# Patient Record
Sex: Male | Born: 1970 | Race: Black or African American | Hispanic: No | Smoking: Current every day smoker
Health system: Southern US, Community
[De-identification: ages and names within clinical notes are randomized; demographics above are authoritative.]

## PROBLEM LIST (undated history)

## (undated) DIAGNOSIS — F329 Major depressive disorder, single episode, unspecified: Secondary | ICD-10-CM

## (undated) DIAGNOSIS — I10 Essential (primary) hypertension: Secondary | ICD-10-CM

## (undated) DIAGNOSIS — F419 Anxiety disorder, unspecified: Secondary | ICD-10-CM

## (undated) DIAGNOSIS — D573 Sickle-cell trait: Secondary | ICD-10-CM

## (undated) DIAGNOSIS — F4310 Post-traumatic stress disorder, unspecified: Secondary | ICD-10-CM

## (undated) DIAGNOSIS — M1990 Unspecified osteoarthritis, unspecified site: Secondary | ICD-10-CM

## (undated) DIAGNOSIS — Z9989 Dependence on other enabling machines and devices: Secondary | ICD-10-CM

## (undated) DIAGNOSIS — J45909 Unspecified asthma, uncomplicated: Secondary | ICD-10-CM

## (undated) DIAGNOSIS — F32A Depression, unspecified: Secondary | ICD-10-CM

## (undated) DIAGNOSIS — G43909 Migraine, unspecified, not intractable, without status migrainosus: Secondary | ICD-10-CM

## (undated) DIAGNOSIS — R51 Headache: Secondary | ICD-10-CM

## (undated) DIAGNOSIS — K219 Gastro-esophageal reflux disease without esophagitis: Secondary | ICD-10-CM

## (undated) DIAGNOSIS — G4733 Obstructive sleep apnea (adult) (pediatric): Secondary | ICD-10-CM

## (undated) DIAGNOSIS — G4730 Sleep apnea, unspecified: Secondary | ICD-10-CM

## (undated) HISTORY — PX: VASECTOMY: SHX75

## (undated) HISTORY — PX: NASAL SEPTUM SURGERY: SHX37

## (undated) HISTORY — PX: BUNIONECTOMY: SHX129

## (undated) HISTORY — PX: COLONOSCOPY: SHX174

## (undated) HISTORY — PX: ESOPHAGOGASTRODUODENOSCOPY ENDOSCOPY: SHX5814

---
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
## 1991-12-01 HISTORY — PX: APPENDECTOMY: SHX54

## 1991-12-01 HISTORY — PX: WRIST FRACTURE SURGERY: SHX121

## 2008-11-30 HISTORY — PX: SHOULDER HEMI-ARTHROPLASTY: SHX5049

## 2011-01-18 ENCOUNTER — Emergency Department (HOSPITAL_COMMUNITY): Payer: Non-veteran care

## 2011-01-18 ENCOUNTER — Emergency Department (HOSPITAL_COMMUNITY)
Admission: EM | Admit: 2011-01-18 | Discharge: 2011-01-18 | Disposition: A | Discharge status: Home / Self Care | Payer: Non-veteran care | Attending: Emergency Medicine | Admitting: Emergency Medicine

## 2011-01-18 DIAGNOSIS — K219 Gastro-esophageal reflux disease without esophagitis: Secondary | ICD-10-CM | POA: Insufficient documentation

## 2011-01-18 DIAGNOSIS — J40 Bronchitis, not specified as acute or chronic: Secondary | ICD-10-CM | POA: Insufficient documentation

## 2011-01-18 DIAGNOSIS — I10 Essential (primary) hypertension: Secondary | ICD-10-CM | POA: Insufficient documentation

## 2011-01-18 DIAGNOSIS — F329 Major depressive disorder, single episode, unspecified: Secondary | ICD-10-CM | POA: Insufficient documentation

## 2011-01-18 DIAGNOSIS — F3289 Other specified depressive episodes: Secondary | ICD-10-CM | POA: Insufficient documentation

## 2011-01-18 DIAGNOSIS — J3489 Other specified disorders of nose and nasal sinuses: Secondary | ICD-10-CM | POA: Insufficient documentation

## 2011-01-18 DIAGNOSIS — G8929 Other chronic pain: Secondary | ICD-10-CM | POA: Insufficient documentation

## 2011-01-18 DIAGNOSIS — R042 Hemoptysis: Secondary | ICD-10-CM | POA: Insufficient documentation

## 2011-01-18 DIAGNOSIS — Z79899 Other long term (current) drug therapy: Secondary | ICD-10-CM | POA: Insufficient documentation

## 2011-01-18 DIAGNOSIS — R059 Cough, unspecified: Secondary | ICD-10-CM | POA: Insufficient documentation

## 2011-01-18 DIAGNOSIS — R05 Cough: Secondary | ICD-10-CM | POA: Insufficient documentation

## 2011-01-18 DIAGNOSIS — J029 Acute pharyngitis, unspecified: Secondary | ICD-10-CM | POA: Insufficient documentation

## 2011-01-18 LAB — RAPID STREP SCREEN (MED CTR MEBANE ONLY): Streptococcus, Group A Screen (Direct): NEGATIVE

## 2011-12-01 HISTORY — PX: SHOULDER ARTHROSCOPY: SHX128

## 2012-03-29 DIAGNOSIS — M19011 Primary osteoarthritis, right shoulder: Secondary | ICD-10-CM | POA: Insufficient documentation

## 2012-03-29 DIAGNOSIS — M25311 Other instability, right shoulder: Secondary | ICD-10-CM | POA: Insufficient documentation

## 2014-01-02 ENCOUNTER — Emergency Department (HOSPITAL_COMMUNITY)
Admission: EM | Admit: 2014-01-02 | Discharge: 2014-01-03 | Disposition: A | Discharge status: Home / Self Care | Payer: Non-veteran care | Attending: Emergency Medicine | Admitting: Emergency Medicine

## 2014-01-02 ENCOUNTER — Encounter (HOSPITAL_COMMUNITY): Payer: Self-pay | Admitting: Emergency Medicine

## 2014-01-02 DIAGNOSIS — IMO0002 Reserved for concepts with insufficient information to code with codable children: Secondary | ICD-10-CM

## 2014-01-02 DIAGNOSIS — F39 Unspecified mood [affective] disorder: Secondary | ICD-10-CM

## 2014-01-02 DIAGNOSIS — Z7289 Other problems related to lifestyle: Secondary | ICD-10-CM

## 2014-01-02 DIAGNOSIS — I10 Essential (primary) hypertension: Secondary | ICD-10-CM | POA: Insufficient documentation

## 2014-01-02 DIAGNOSIS — X789XXA Intentional self-harm by unspecified sharp object, initial encounter: Secondary | ICD-10-CM | POA: Insufficient documentation

## 2014-01-02 DIAGNOSIS — F1010 Alcohol abuse, uncomplicated: Secondary | ICD-10-CM | POA: Insufficient documentation

## 2014-01-02 DIAGNOSIS — S21109A Unspecified open wound of unspecified front wall of thorax without penetration into thoracic cavity, initial encounter: Secondary | ICD-10-CM | POA: Insufficient documentation

## 2014-01-02 DIAGNOSIS — F10929 Alcohol use, unspecified with intoxication, unspecified: Secondary | ICD-10-CM

## 2014-01-02 DIAGNOSIS — F17200 Nicotine dependence, unspecified, uncomplicated: Secondary | ICD-10-CM | POA: Insufficient documentation

## 2014-01-02 DIAGNOSIS — F430 Acute stress reaction: Secondary | ICD-10-CM

## 2014-01-02 DIAGNOSIS — S31109A Unspecified open wound of abdominal wall, unspecified quadrant without penetration into peritoneal cavity, initial encounter: Secondary | ICD-10-CM | POA: Insufficient documentation

## 2014-01-02 HISTORY — DX: Essential (primary) hypertension: I10

## 2014-01-02 HISTORY — DX: Depression, unspecified: F32.A

## 2014-01-02 HISTORY — DX: Major depressive disorder, single episode, unspecified: F32.9
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
## 2014-01-02 NOTE — ED Notes (Signed)
Pt refused to cooperate when asked to change into blue scrubs.
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
## 2014-01-02 NOTE — ED Notes (Signed)
Bed: [facility] Expected date:  Expected time:  Means of arrival:  Comments: EMS/SI/superficial lacs

## 2014-01-03 LAB — RAPID URINE DRUG SCREEN, HOSP PERFORMED
AMPHETAMINES: NOT DETECTED
Barbiturates: NOT DETECTED
Benzodiazepines: NOT DETECTED
Cocaine: NOT DETECTED
Opiates: NOT DETECTED
TETRAHYDROCANNABINOL: NOT DETECTED

## 2014-01-03 LAB — CBC
HCT: 48.6 % (ref 39.0–52.0)
HEMOGLOBIN: 18 g/dL — AB (ref 13.0–17.0)
MCH: 33 pg (ref 26.0–34.0)
MCHC: 37 g/dL — ABNORMAL HIGH (ref 30.0–36.0)
MCV: 89.2 fL (ref 78.0–100.0)
PLATELETS: 228 10*3/uL (ref 150–400)
RBC: 5.45 MIL/uL (ref 4.22–5.81)
RDW: 13.3 % (ref 11.5–15.5)
WBC: 8.7 10*3/uL (ref 4.0–10.5)

## 2014-01-03 LAB — COMPREHENSIVE METABOLIC PANEL
ALK PHOS: 77 U/L (ref 39–117)
ALT: 21 U/L (ref 0–53)
AST: 21 U/L (ref 0–37)
Albumin: 4.8 g/dL (ref 3.5–5.2)
BILIRUBIN TOTAL: 0.4 mg/dL (ref 0.3–1.2)
BUN: 15 mg/dL (ref 6–23)
CHLORIDE: 98 meq/L (ref 96–112)
CO2: 26 meq/L (ref 19–32)
Calcium: 9.5 mg/dL (ref 8.4–10.5)
Creatinine, Ser: 1.06 mg/dL (ref 0.50–1.35)
GFR calc Af Amer: >90 mL/min (ref >90)
GFR, EST NON AFRICAN AMERICAN: 86 mL/min — AB (ref >90)
GLUCOSE: 109 mg/dL — AB (ref 70–99)
POTASSIUM: 3.4 meq/L — AB (ref 3.7–5.3)
SODIUM: 139 meq/L (ref 137–147)
Total Protein: 8.1 g/dL (ref 6.0–8.3)

## 2014-01-03 LAB — ACETAMINOPHEN LEVEL

## 2014-01-03 LAB — SALICYLATE LEVEL: Salicylate Lvl: <2 mg/dL — ABNORMAL LOW (ref 2.8–20.0)

## 2014-01-03 LAB — ETHANOL: ALCOHOL ETHYL (B): 176 mg/dL — AB (ref 0–11)

## 2014-01-03 MED ORDER — POTASSIUM CHLORIDE CRYS ER 20 MEQ PO TBCR
40.0000 meq | EXTENDED_RELEASE_TABLET | Freq: Once | ORAL | Status: AC
Start: 2014-01-03 — End: 2014-01-03
  Administered 2014-01-03: 40 meq via ORAL
  Filled 2014-01-03: qty 2

## 2014-01-03 MED ORDER — AMLODIPINE BESYLATE 5 MG PO TABS
5.0000 mg | ORAL_TABLET | Freq: Every day | ORAL | Status: DC
  Administered 2014-01-03: 5 mg via ORAL
  Filled 2014-01-03 (×2): qty 1

## 2014-01-03 NOTE — ED Notes (Signed)
Patient is alert and oriented x3.  He was given DC instructions and follow up visit instructions.  Patient gave verbal understanding.  He was DC ambulatory under his own power to home.  V/S stable.  He was not showing any signs of distress on DC 

## 2014-01-03 NOTE — ED Notes (Addendum)
error 
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
## 2014-01-03 NOTE — ED Notes (Signed)
TTS consult in progress. °

## 2014-01-03 NOTE — BH Assessment (Signed)
Spoke with charge nurse to inform her of pt not meeting criteria.

## 2014-01-03 NOTE — Discharge Instructions (Signed)
Avoid alcohol use.  Follow up with your doctor/psychiatrist in the next couple days.  Return to ER right away if worse, severe depression, thoughts of harm to self or others, other concern.  Your blood pressure is high this evening.  Limit salt intake, continue blood pressure medication, and follow up with your doctor in the next few days for recheck.   No driving for the next 4 hours, or any time when drinking alcohol.       Alcohol Intoxication Alcohol intoxication occurs when the amount of alcohol that a person has consumed impairs his or her ability to mentally and physically function. Alcohol directly impairs the normal chemical activity of the brain. Drinking large amounts of alcohol can lead to changes in mental function and behavior, and it can cause many physical effects that can be harmful.  Alcohol intoxication can range in severity from mild to very severe. Various factors can affect the level of intoxication that occurs, such as the person's age, gender, weight, frequency of alcohol consumption, and the presence of other medical conditions (such as diabetes, seizures, or heart conditions). Dangerous levels of alcohol intoxication may occur when people drink large amounts of alcohol in a short period (binge drinking). Alcohol can also be especially dangerous when combined with certain prescription medicines or "recreational" drugs. SIGNS AND SYMPTOMS Some common signs and symptoms of mild alcohol intoxication include:  Loss of coordination.  Changes in mood and behavior.  Impaired judgment.  Slurred speech. As alcohol intoxication progresses to more severe levels, other signs and symptoms will appear. These may include:  Vomiting.  Confusion and impaired memory.  Slowed breathing.  Seizures.  Loss of consciousness. DIAGNOSIS  Your health care provider will take a medical history and perform a physical exam. You will be asked about the amount and type of alcohol you  have consumed. Blood tests will be done to measure the concentration of alcohol in your blood. In many places, your blood alcohol level must be lower than 80 mg/dL (0.08%) to legally drive. However, many dangerous effects of alcohol can occur at much lower levels.  TREATMENT  People with alcohol intoxication often do not require treatment. Most of the effects of alcohol intoxication are temporary, and they go away as the alcohol naturally leaves the body. Your health care provider will monitor your condition until you are stable enough to go home. Fluids are sometimes given through an IV access tube to help prevent dehydration.  HOME CARE INSTRUCTIONS  Do not drive after drinking alcohol.  Stay hydrated. Drink enough water and fluids to keep your urine clear or pale yellow. Avoid caffeine.   Only take over-the-counter or prescription medicines as directed by your health care provider.  SEEK MEDICAL CARE IF:   You have persistent vomiting.   You do not feel better after a few days.  You have frequent alcohol intoxication. Your health care provider can help determine if you should see a substance use treatment counselor. SEEK IMMEDIATE MEDICAL CARE IF:   You become shaky or tremble when you try to stop drinking.   You shake uncontrollably (seizure).   You throw up (vomit) blood. This may be bright red or may look like black coffee grounds.   You have blood in your stool. This may be bright red or may appear as a black, tarry, bad smelling stool.   You become lightheaded or faint.  MAKE SURE YOU:   Understand these instructions.  Will watch your condition.  Will get  help right away if you are not doing well or get worse. Document Released: 08/26/2005 Document Revised: 07/19/2013 Document Reviewed: 04/21/2013 [facility] Patient Information 2014 [place].    Mood Disorders Mood disorders are conditions that affect the way a person feels emotionally. The main mood  disorders include:  Depression.  Bipolar disorder.  Dysthymia. Dysthymia is a mild, lasting (chronic) depression. Symptoms of dysthymia are similar to depression, but not as severe.  Cyclothymia. Cyclothymia includes mood swings, but the highs and lows are not as severe as they are in bipolar disorder. Symptoms of cyclothymia are similar to those of bipolar disorder, but less extreme. CAUSES  Mood disorders are probably caused by a combination of factors. People with mood disorders seem to have physical and chemical changes in their brains. Mood disorders run in families, so there may be genetic causes. Severe trauma or stressful life events may also increase the risk of mood disorders.  SYMPTOMS  Symptoms of mood disorders depend on the specific type of condition. Depression symptoms include:  Feeling sad, worthless, or hopeless.  Negative thoughts.  Inability to enjoy one's usual activities.  Low energy.  Sleeping too much or too little.  Appetite changes.  Crying.  Concentration problems.  Thoughts of harming oneself. Bipolar disorder symptoms include:  Periods of depression (see above symptoms).  Mood swings, from sadness and depression, to abnormal elation and excitement.  Periods of mania:  Racing thoughts.  Fast speech.  Poor judgment, and careless, dangerous choices.  Decreased need for sleep.  Risky behavior.  Difficulty concentrating.  Irritability.  Increased energy.  Increased sex drive. DIAGNOSIS  There are no blood tests or X-rays that can confirm a mood disorder. However, your caregiver may choose to run some tests to make sure that there is not another physical cause for your symptoms. A mood disorder is usually diagnosed after an in-depth interview with a caregiver. TREATMENT  Mood disorders can be treated with one or more of the following:  Medicine. This may include antidepressants, mood-stabilizers, or anti-psychotics.  Psychotherapy  (talk therapy).  Cognitive behavioral therapy. You are taught to recognize negative thoughts and behavior patterns, and replace them with healthy thoughts and behaviors.  Electroconvulsive therapy. For very severe cases of deep depression, a series of treatments in which an electrical current is applied to the brain.  Vagus nerve stimulation. A pulse of electricity is applied to a portion of the brain.  Transcranial magnetic stimulation. Powerful magnets are placed on the head that produce electrical currents.  Hospitalization. In severe situations, or when someone is having serious thoughts of harming him or herself, hospitalization may be necessary in order to keep the person safe. This is also done to quickly start and monitor treatment. HOME CARE INSTRUCTIONS   Take your medicine exactly as directed.  Attend all of your therapy sessions.  Try to eat regular, healthy meals.  Exercise daily. Exercise may improve mood symptoms.  Get good sleep.  Do not drink alcohol or use pot or other drugs. These can worsen mood symptoms and cause anxiety and psychosis.  Tell your caregiver if you develop any side effects, such as feeling sick to your stomach (nauseous), dry mouth, dizziness, constipation, drowsiness, tremor, weight gain, or sexual symptoms. He or she may suggest things you can do to improve symptoms.  Learn ways to cope with the stress of having a chronic illness. This includes yoga, meditation, tai chi, or participating in a support group.  Drink enough water to keep your  urine clear or pale yellow. Eat a high-fiber diet. These habits may help you avoid constipation from your medicine. SEEK IMMEDIATE MEDICAL CARE IF:  Your mood worsens.  You have thoughts of hurting yourself or others.  You cannot care for yourself.  You develop the sensation of hearing or seeing something that is not actually present (auditory or visual hallucinations).  You develop abnormal  thoughts. Document Released: 09/13/2009 Document Revised: 02/08/2012 Document Reviewed: 09/13/2009 [facility] Patient Information 2014 [place], [place].    Stress Stress-related medical problems are becoming increasingly common. The body has a built-in physical response to stressful situations. Faced with pressure, challenge or danger, we need to react quickly. Our bodies release hormones such as cortisol and adrenaline to help do this. These hormones are part of the "fight or flight" response and affect the metabolic rate, heart rate and blood pressure, resulting in a heightened, stressed state that prepares the body for optimum performance in dealing with a stressful situation. It is likely that early man required these mechanisms to stay alive, but usually modern stresses do not call for this, and the same hormones released in today's world can damage health and reduce coping ability. CAUSES  Pressure to perform at work, at school or in sports.  Threats of physical violence.  Money worries.  Arguments.  Family conflicts.  Divorce or separation from significant other.  Bereavement.  New job or unemployment.  Changes in location.  Alcohol or drug abuse. SOMETIMES, THERE IS NO PARTICULAR REASON FOR DEVELOPING STRESS. Almost all people are at risk of being stressed at some time in their lives. It is important to know that some stress is temporary and some is long term.  Temporary stress will go away when a situation is resolved. Most people can cope with short periods of stress, and it can often be relieved by relaxing, taking a walk, chatting through issues with friends, or having a good night's sleep.  Chronic (long-term, continuous) stress is much harder to deal with. It can be psychologically and emotionally damaging. It can be harmful both for an individual and for friends and family. SYMPTOMS Everyone reacts to stress differently. There are some common effects that help [place]  recognize it. In times of extreme stress, people may:  Shake uncontrollably.  Breathe faster and deeper than normal (hyperventilate).  Vomit.  For people with asthma, stress can trigger an attack.  For some people, stress may trigger migraine headaches, ulcers, and body pain. PHYSICAL EFFECTS OF STRESS MAY INCLUDE:  Loss of energy.  Skin problems.  Aches and pains resulting from tense muscles, including neck ache, backache and tension headaches.  Increased pain from arthritis and other conditions.  Irregular heart beat (palpitations).  Periods of irritability or anger.  Apathy or depression.  Anxiety (feeling uptight or worrying).  Unusual behavior.  Loss of appetite.  Comfort eating.  Lack of concentration.  Loss of, or decreased, sex-drive.  Increased smoking, drinking, or recreational drug use.  For women, missed periods.  Ulcers, joint pain, and muscle pain. Post-traumatic stress is the stress caused by any serious accident, strong emotional damage, or extremely difficult or violent experience such as rape or war. Post-traumatic stress victims can experience mixtures of emotions such as fear, shame, depression, guilt or anger. It may include recurrent memories or images that may be haunting. These feelings can last for weeks, months or even years after the traumatic event that triggered them. Specialized treatment, possibly with medicines and psychological therapies, is available. If stress  is causing physical symptoms, severe distress or making it difficult for you to function as normal, it is worth seeing your caregiver. It is important to remember that although stress is a usual part of life, extreme or prolonged stress can lead to other illnesses that will need treatment. It is better to visit a doctor sooner rather than later. Stress has been linked to the development of high blood pressure and heart disease, as well as insomnia and depression. There is no  diagnostic test for stress since everyone reacts to it differently. But a caregiver will be able to spot the physical symptoms, such as:  Headaches.  Shingles.  Ulcers. Emotional distress such as intense worry, low mood or irritability should be detected when the doctor asks pertinent questions to identify any underlying problems that might be the cause. In case there are physical reasons for the symptoms, the doctor may also want to do some tests to exclude certain conditions. If you feel that you are suffering from stress, try to identify the aspects of your life that are causing it. Sometimes you may not be able to change or avoid them, but even a small change can have a positive ripple effect. A simple lifestyle change can make all the difference. STRATEGIES THAT CAN HELP DEAL WITH STRESS:  Delegating or sharing responsibilities.  Avoiding confrontations.  Learning to be more assertive.  Regular exercise.  Avoid using alcohol or street drugs to cope.  Eating a healthy, balanced diet, rich in fruit and vegetables and proteins.  Finding humor or absurdity in stressful situations.  Never taking on more than you know you can handle comfortably.  Organizing your time better to get as much done as possible.  Talking to friends or family and sharing your thoughts and fears.  Listening to music or relaxation tapes.  Tensing and then relaxing your muscles, starting at the toes and working up to the head and neck. If you think that you would benefit from help, either in identifying the things that are causing your stress or in learning techniques to help you relax, see a caregiver who is capable of helping you with this. Rather than relying on medications, it is usually better to try and identify the things in your life that are causing stress and try to deal with them. There are many techniques of managing stress including counseling, psychotherapy, aromatherapy, yoga, and exercise.  Your caregiver can help you determine what is best for you. Document Released: 02/06/2003 Document Revised: 02/08/2012 Document Reviewed: 01/03/2008 [facility] [place] Patient Information 2014 [place], [place].   Depression, Adult Depression refers to feeling sad, low, down in the dumps, blue, gloomy, or empty. In general, there are two kinds of depression: 1. Depression that we all experience from time to time because of upsetting life experiences, including the loss of a job or the ending of a relationship (normal sadness or normal grief). This kind of depression is considered normal, is short lived, and resolves within a few days to 2 weeks. (Depression experienced after the loss of a loved one is called bereavement. Bereavement often lasts longer than 2 weeks but normally gets better with time.) 2. Clinical depression, which lasts longer than normal sadness or normal grief or interferes with your ability to function at home, at work, and in school. It also interferes with your personal relationships. It affects almost every aspect of your life. Clinical depression is an illness. Symptoms of depression also can be caused by conditions other than normal sadness  and grief or clinical depression. Examples of these conditions are listed as follows:  Physical illness Some physical illnesses, including underactive thyroid gland (hypothyroidism), severe anemia, specific types of cancer, diabetes, uncontrolled seizures, heart and lung problems, strokes, and chronic pain are commonly associated with symptoms of depression.  Side effects of some prescription medicine In some people, certain types of prescription medicine can cause symptoms of depression.  Substance abuse Abuse of alcohol and illicit drugs can cause symptoms of depression. SYMPTOMS Symptoms of normal sadness and normal grief include the following:  Feeling sad or crying for short periods of time.  Not caring about anything (apathy).  Difficulty  sleeping or sleeping too much.  No longer able to enjoy the things you used to enjoy.  Desire to be by oneself all the time (social isolation).  Lack of energy or motivation.  Difficulty concentrating or remembering.  Change in appetite or weight.  Restlessness or agitation. Symptoms of clinical depression include the same symptoms of normal sadness or normal grief and also the following symptoms:  Feeling sad or crying all the time.  Feelings of guilt or worthlessness.  Feelings of hopelessness or helplessness.  Thoughts of suicide or the desire to harm yourself (suicidal ideation).  Loss of touch with reality (psychotic symptoms). Seeing or hearing things that are not real (hallucinations) or having false beliefs about your life or the people around you (delusions and paranoia). DIAGNOSIS  The diagnosis of clinical depression usually is based on the severity and duration of the symptoms. Your caregiver also will ask you questions about your medical history and substance use to find out if physical illness, use of prescription medicine, or substance abuse is causing your depression. Your caregiver also may order blood tests. TREATMENT  Typically, normal sadness and normal grief do not require treatment. However, sometimes antidepressant medicine is prescribed for bereavement to ease the depressive symptoms until they resolve. The treatment for clinical depression depends on the severity of your symptoms but typically includes antidepressant medicine, counseling with a mental health professional, or a combination of both. Your caregiver will help to determine what treatment is best for you. Depression caused by physical illness usually goes away with appropriate medical treatment of the illness. If prescription medicine is causing depression, talk with your caregiver about stopping the medicine, decreasing the dose, or substituting another medicine. Depression caused by abuse of alcohol  or illicit drugs abuse goes away with abstinence from these substances. Some adults need professional help in order to stop drinking or using drugs. SEEK IMMEDIATE CARE IF:  You have thoughts about hurting yourself or others.  You lose touch with reality (have psychotic symptoms).  You are taking medicine for depression and have a serious side effect. FOR MORE INFORMATION National Alliance on Mental Illness: www.nami.[employer]: https://carter.com/ Document Released: 11/13/2000 Document Revised: 05/17/2012 Document Reviewed: 02/15/2012 [facility] Patient Information 2014 [place].   Wound Care Wound care helps prevent pain and infection.  You may need a tetanus shot if:  You cannot remember when you had your last tetanus shot.  You have never had a tetanus shot.  The injury broke your skin. If you need a tetanus shot and you choose not to have one, you may get tetanus. Sickness from tetanus can be serious. HOME CARE   Only take medicine as told by your doctor.  Clean the wound daily with mild soap and water.  Change any bandages (dressings) as told by your doctor.  Put medicated cream and a bandage on the wound as told by your doctor.  Change the bandage if it gets wet, dirty, or starts to smell.  Take showers. Do not take baths, swim, or do anything that puts your wound under water.  Rest and raise (elevate) the wound until the pain and puffiness (swelling) are better.  Keep all doctor visits as told. GET HELP RIGHT AWAY IF:   Yellowish-white fluid (pus) comes from the wound.  Medicine does not lessen your pain.  There is a red streak going away from the wound.  You have a fever. MAKE SURE YOU:   Understand these instructions.  Will watch your condition.  Will get help right away if you are not doing well or get worse. Document Released: 08/25/2008 Document Revised: 02/08/2012 Document Reviewed: 03/22/2011 [facility] Patient  Information 2014 [place], [place].   Hypertension As your heart beats, it forces blood through your arteries. This force is your blood pressure. If the pressure is too high, it is called hypertension (HTN) or high blood pressure. HTN is dangerous because you may have it and not know it. High blood pressure may mean that your heart has to work harder to pump blood. Your arteries may be narrow or stiff. The extra work puts you at risk for heart disease, stroke, and other problems.  Blood pressure consists of two numbers, a higher number over a lower, 110/72, for example. It is stated as "110 over 72." The ideal is below 120 for the top number (systolic) and under 80 for the bottom (diastolic). Write down your blood pressure today. You should pay close attention to your blood pressure if you have certain conditions such as:  Heart failure.  Prior heart attack.  Diabetes  Chronic kidney disease.  Prior stroke.  Multiple risk factors for heart disease. To see if you have HTN, your blood pressure should be measured while you are seated with your arm held at the level of the heart. It should be measured at least twice. A one-time elevated blood pressure reading (especially in the Emergency Department) does not mean that you need treatment. There may be conditions in which the blood pressure is different between your right and left arms. It is important to see your caregiver soon for a recheck. Most people have essential hypertension which means that there is not a specific cause. This type of high blood pressure may be lowered by changing lifestyle factors such as:  Stress.  Smoking.  Lack of exercise.  Excessive weight.  Drug/tobacco/alcohol use.  Eating less salt. Most people do not have symptoms from high blood pressure until it has caused damage to the body. Effective treatment can often prevent, delay or reduce that damage. TREATMENT  When a cause has been identified, treatment for  high blood pressure is directed at the cause. There are a large number of medications to treat HTN. These fall into several categories, and your caregiver will help you select the medicines that are best for you. Medications may have side effects. You should review side effects with your caregiver. If your blood pressure stays high after you have made lifestyle changes or started on medicines,   Your medication(s) may need to be changed.  Other problems may need to be addressed.  Be certain you understand your prescriptions, and know how and when to take your medicine.  Be sure to follow up with your caregiver within the time frame advised (usually within two weeks) to have your blood  pressure rechecked and to review your medications.  If you are taking more than one medicine to lower your blood pressure, make sure you know how and at what times they should be taken. Taking two medicines at the same time can result in blood pressure that is too low. SEEK IMMEDIATE MEDICAL CARE IF:  You develop a severe headache, blurred or changing vision, or confusion.  You have unusual weakness or numbness, or a faint feeling.  You have severe chest or abdominal pain, vomiting, or breathing problems. MAKE SURE YOU:   Understand these instructions.  Will watch your condition.  Will get help right away if you are not doing well or get worse. Document Released: 11/16/2005 Document Revised: 02/08/2012 Document Reviewed: 07/06/2008 [facility] Patient Information 2014 [place].

## 2014-02-06 ENCOUNTER — Other Ambulatory Visit: Payer: Self-pay | Admitting: Orthopedic Surgery

## 2014-03-01 ENCOUNTER — Encounter (HOSPITAL_COMMUNITY): Payer: Self-pay | Admitting: Pharmacy Technician

## 2014-03-02 ENCOUNTER — Encounter (HOSPITAL_COMMUNITY)
Admission: RE | Admit: 2014-03-02 | Discharge: 2014-03-02 | Disposition: A | Discharge status: Home / Self Care | Payer: Non-veteran care | Source: Ambulatory Visit | Attending: Orthopedic Surgery | Admitting: Orthopedic Surgery

## 2014-03-02 ENCOUNTER — Ambulatory Visit (HOSPITAL_COMMUNITY)
Admission: RE | Admit: 2014-03-02 | Discharge: 2014-03-02 | Disposition: A | Discharge status: Home / Self Care | Payer: Non-veteran care | Source: Ambulatory Visit | Attending: Orthopedic Surgery | Admitting: Orthopedic Surgery

## 2014-03-02 ENCOUNTER — Other Ambulatory Visit (HOSPITAL_COMMUNITY): Payer: Non-veteran care

## 2014-03-02 ENCOUNTER — Encounter (HOSPITAL_COMMUNITY): Payer: Self-pay

## 2014-03-02 DIAGNOSIS — Z01812 Encounter for preprocedural laboratory examination: Secondary | ICD-10-CM | POA: Insufficient documentation

## 2014-03-02 DIAGNOSIS — Z01810 Encounter for preprocedural cardiovascular examination: Secondary | ICD-10-CM | POA: Insufficient documentation

## 2014-03-02 DIAGNOSIS — Z01818 Encounter for other preprocedural examination: Secondary | ICD-10-CM | POA: Insufficient documentation

## 2014-03-02 HISTORY — DX: Unspecified osteoarthritis, unspecified site: M19.90

## 2014-03-02 HISTORY — DX: Post-traumatic stress disorder, unspecified: F43.10

## 2014-03-02 HISTORY — DX: Unspecified asthma, uncomplicated: J45.909

## 2014-03-02 HISTORY — DX: Gastro-esophageal reflux disease without esophagitis: K21.9

## 2014-03-02 HISTORY — DX: Headache: R51

## 2014-03-02 HISTORY — DX: Sleep apnea, unspecified: G47.30

## 2014-03-02 LAB — CBC WITH DIFFERENTIAL/PLATELET
BASOS ABS: 0 10*3/uL (ref 0.0–0.1)
Basophils Relative: 0 % (ref 0–1)
EOS PCT: 1 % (ref 0–5)
Eosinophils Absolute: 0.1 10*3/uL (ref 0.0–0.7)
HCT: 48 % (ref 39.0–52.0)
Hemoglobin: 17.7 g/dL — ABNORMAL HIGH (ref 13.0–17.0)
LYMPHS PCT: 36 % (ref 12–46)
Lymphs Abs: 2.6 10*3/uL (ref 0.7–4.0)
MCH: 33.3 pg (ref 26.0–34.0)
MCHC: 36.9 g/dL — ABNORMAL HIGH (ref 30.0–36.0)
MCV: 90.2 fL (ref 78.0–100.0)
Monocytes Absolute: 0.5 10*3/uL (ref 0.1–1.0)
Monocytes Relative: 7 % (ref 3–12)
NEUTROS PCT: 55 % (ref 43–77)
Neutro Abs: 3.9 10*3/uL (ref 1.7–7.7)
Platelets: 208 10*3/uL (ref 150–400)
RBC: 5.32 MIL/uL (ref 4.22–5.81)
RDW: 13.3 % (ref 11.5–15.5)
WBC: 7.1 10*3/uL (ref 4.0–10.5)

## 2014-03-02 LAB — APTT: APTT: 26 s (ref 24–37)

## 2014-03-02 LAB — URINALYSIS, ROUTINE W REFLEX MICROSCOPIC
Bilirubin Urine: NEGATIVE
Glucose, UA: NEGATIVE mg/dL
Hgb urine dipstick: NEGATIVE
Ketones, ur: NEGATIVE mg/dL
LEUKOCYTES UA: NEGATIVE
Nitrite: NEGATIVE
PROTEIN: NEGATIVE mg/dL
Specific Gravity, Urine: 1.02 (ref 1.005–1.030)
UROBILINOGEN UA: 0.2 mg/dL (ref 0.0–1.0)
pH: 5 (ref 5.0–8.0)

## 2014-03-02 LAB — COMPREHENSIVE METABOLIC PANEL
ALT: 27 U/L (ref 0–53)
AST: 25 U/L (ref 0–37)
Albumin: 4.5 g/dL (ref 3.5–5.2)
Alkaline Phosphatase: 71 U/L (ref 39–117)
BUN: 14 mg/dL (ref 6–23)
CALCIUM: 9.5 mg/dL (ref 8.4–10.5)
CO2: 25 meq/L (ref 19–32)
Chloride: 102 mEq/L (ref 96–112)
Creatinine, Ser: 1.12 mg/dL (ref 0.50–1.35)
GFR calc Af Amer: >90 mL/min (ref >90)
GFR, EST NON AFRICAN AMERICAN: 80 mL/min — AB (ref >90)
Glucose, Bld: 88 mg/dL (ref 70–99)
Potassium: 4.1 mEq/L (ref 3.7–5.3)
SODIUM: 144 meq/L (ref 137–147)
TOTAL PROTEIN: 7.7 g/dL (ref 6.0–8.3)
Total Bilirubin: 0.6 mg/dL (ref 0.3–1.2)

## 2014-03-02 LAB — ABO/RH: ABO/RH(D): A POS

## 2014-03-02 LAB — PROTIME-INR
INR: 1.01 (ref 0.00–1.49)
Prothrombin Time: 13.1 seconds (ref 11.6–15.2)

## 2014-03-02 LAB — TYPE AND SCREEN
ABO/RH(D): A POS
ANTIBODY SCREEN: NEGATIVE

## 2014-03-02 NOTE — Progress Notes (Signed)
Pt states all his medical care is thru [facility]. He stated that he had sob and chest tightness several years ago one time and had an EKG and stress test done and was told everything looked ok. He states he was given NTG to carry but has never had to use it and doesn't even know where it is now. Denies any recent chest pain or sob. His more recent issues have involved feeling depressed since he and his wife separated in January, 2015. At that time, he did have a suicide attempt, [facility]. He states he has no suicidal thoughts at this time.

## 2014-03-02 NOTE — Pre-Procedure Instructions (Signed)
Frederick Roberts  03/02/2014   Your procedure is scheduled on:  Thursday, March 15, 2014 at 7:30 AM.   Report to [facility] Entrance "A" Admitting Office at 5:30 AM.   Call this number if you have problems the morning of surgery: [phone]   Remember:   Do not eat food or drink liquids after midnight Wednesday, 03/14/14.   Take these medicines the morning of surgery with A SIP OF WATER: amLODipine (NORVASC), atenolol (TENORMIN),  buPROPion (ZYBAN),  hydrALAZINE (APRESOLINE), omeprazole (PRILOSEC), traMADol (ULTRAM) - if needed, traMADol (ULTRAM) - if needed.  Stop Mobic (Meloxicam) as of Wednesday, 03/07/14. Do not take any NSAIDS (Ibuprofen, Aleve, Motrin, etc) 7 days prior to surgery.    Do not wear jewelry.  Do not wear lotions, powders, or cologne. You may NOT wear deodorant.  Men may shave face and neck.  Do not bring valuables to the hospital.  [facility] is not responsible                  for any belongings or valuables.               Contacts, dentures or bridgework may not be worn into surgery.  Leave suitcase in the car. After surgery it may be brought to your room.  For patients admitted to the hospital, discharge time is determined by your                treatment team.     Special Instructions: [facility] - Preparing for Surgery  Before surgery, you can play an important role.  Because skin is not sterile, your skin needs to be as free of germs as possible.  You can reduce the number of germs on you skin by washing with CHG (chlorahexidine gluconate) soap before surgery.  CHG is an antiseptic cleaner which kills germs and bonds with the skin to continue killing germs even after washing.  Please DO NOT use if you have an allergy to CHG or antibacterial soaps.  If your skin becomes reddened/irritated stop using the CHG and inform your nurse when you arrive at Short Stay.  Do not shave (including legs and underarms) for at least 48 hours prior to the first CHG  shower.  You may shave your face.  Please follow these instructions carefully:   1.  Shower with CHG Soap the night before surgery and the                                morning of Surgery.  2.  If you choose to wash your hair, wash your hair first as usual with your       normal shampoo.  3.  After you shampoo, rinse your hair and body thoroughly to remove the                      Shampoo.  4.  Use CHG as you would any other liquid soap.  You can apply chg directly       to the skin and wash gently with scrungie or a clean washcloth.  5.  Apply the CHG Soap to your body ONLY FROM THE NECK DOWN.        Do not use on open wounds or open sores.  Avoid contact with your eyes, ears, mouth and genitals (private parts).  Wash genitals (private parts) with your normal soap.  6.  Wash thoroughly, paying special attention to the area where your surgery        will be performed.  7.  Thoroughly rinse your body with warm water from the neck down.  8.  DO NOT shower/wash with your normal soap after using and rinsing off       the CHG Soap.  9.  Pat yourself dry with a clean towel.            10.  Wear clean pajamas.            11.  Place clean sheets on your bed the night of your first shower and do not        sleep with pets.  Day of Surgery  Do not apply any lotions/deodorants the morning of surgery.  Please wear clean clothes to the hospital/surgery center.     Please read over the following fact sheets that you were given: Pain Booklet, Coughing and Deep Breathing, Blood Transfusion Information and Surgical Site Infection Prevention

## 2014-03-14 MED ORDER — POVIDONE-IODINE 7.5 % EX SOLN
Freq: Once | CUTANEOUS | Status: DC
  Filled 2014-03-14: qty 118

## 2014-03-14 MED ORDER — CEFAZOLIN SODIUM-DEXTROSE 2-3 GM-% IV SOLR
2.0000 g | INTRAVENOUS | Status: AC
  Administered 2014-03-15: 2 g via INTRAVENOUS
  Filled 2014-03-14: qty 50

## 2014-03-15 ENCOUNTER — Encounter (HOSPITAL_COMMUNITY)
Admission: RE | Disposition: A | Discharge status: Home / Self Care | Payer: Self-pay | Source: Ambulatory Visit | Attending: Orthopedic Surgery

## 2014-03-15 ENCOUNTER — Inpatient Hospital Stay (HOSPITAL_COMMUNITY): Payer: Non-veteran care

## 2014-03-15 ENCOUNTER — Inpatient Hospital Stay (HOSPITAL_COMMUNITY)
Admission: RE | Admit: 2014-03-15 | Discharge: 2014-03-16 | DRG: 483 | Disposition: A | Discharge status: Home / Self Care | Payer: Non-veteran care | Source: Ambulatory Visit | Attending: Orthopedic Surgery | Admitting: Orthopedic Surgery

## 2014-03-15 ENCOUNTER — Encounter (HOSPITAL_COMMUNITY): Payer: Non-veteran care | Admitting: Anesthesiology

## 2014-03-15 ENCOUNTER — Encounter (HOSPITAL_COMMUNITY): Payer: Self-pay | Admitting: *Deleted

## 2014-03-15 ENCOUNTER — Inpatient Hospital Stay (HOSPITAL_COMMUNITY): Payer: Non-veteran care | Admitting: Anesthesiology

## 2014-03-15 DIAGNOSIS — F411 Generalized anxiety disorder: Secondary | ICD-10-CM | POA: Diagnosis present

## 2014-03-15 DIAGNOSIS — F329 Major depressive disorder, single episode, unspecified: Secondary | ICD-10-CM | POA: Diagnosis present

## 2014-03-15 DIAGNOSIS — G4733 Obstructive sleep apnea (adult) (pediatric): Secondary | ICD-10-CM | POA: Diagnosis present

## 2014-03-15 DIAGNOSIS — I10 Essential (primary) hypertension: Secondary | ICD-10-CM | POA: Diagnosis present

## 2014-03-15 DIAGNOSIS — F4310 Post-traumatic stress disorder, unspecified: Secondary | ICD-10-CM | POA: Diagnosis present

## 2014-03-15 DIAGNOSIS — F17200 Nicotine dependence, unspecified, uncomplicated: Secondary | ICD-10-CM | POA: Diagnosis present

## 2014-03-15 DIAGNOSIS — K219 Gastro-esophageal reflux disease without esophagitis: Secondary | ICD-10-CM | POA: Diagnosis present

## 2014-03-15 DIAGNOSIS — Z9089 Acquired absence of other organs: Secondary | ICD-10-CM

## 2014-03-15 DIAGNOSIS — F3289 Other specified depressive episodes: Secondary | ICD-10-CM | POA: Diagnosis present

## 2014-03-15 DIAGNOSIS — Z9852 Vasectomy status: Secondary | ICD-10-CM

## 2014-03-15 DIAGNOSIS — M19019 Primary osteoarthritis, unspecified shoulder: Principal | ICD-10-CM | POA: Diagnosis present

## 2014-03-15 DIAGNOSIS — D573 Sickle-cell trait: Secondary | ICD-10-CM | POA: Diagnosis present

## 2014-03-15 DIAGNOSIS — Z79899 Other long term (current) drug therapy: Secondary | ICD-10-CM

## 2014-03-15 HISTORY — DX: Obstructive sleep apnea (adult) (pediatric): G47.33

## 2014-03-15 HISTORY — DX: Sickle-cell trait: D57.3

## 2014-03-15 HISTORY — DX: Migraine, unspecified, not intractable, without status migrainosus: G43.909

## 2014-03-15 HISTORY — PX: SHOULDER HEMI-ARTHROPLASTY: SHX5049

## 2014-03-15 HISTORY — DX: Dependence on other enabling machines and devices: Z99.89

## 2014-03-15 HISTORY — DX: Anxiety disorder, unspecified: F41.9

## 2014-03-15 SURGERY — HEMIARTHROPLASTY, SHOULDER
Anesthesia: General | Site: Shoulder | Laterality: Right

## 2014-03-15 MED ORDER — OXYCODONE HCL 5 MG PO TABS: 5.0000 mg | ORAL_TABLET | Freq: Once | ORAL | Status: DC | PRN

## 2014-03-15 MED ORDER — BISACODYL 10 MG RE SUPP: 10.0000 mg | Freq: Every day | RECTAL | Status: DC | PRN

## 2014-03-15 MED ORDER — POLYETHYLENE GLYCOL 3350 17 G PO PACK: 17.0000 g | PACK | Freq: Every day | ORAL | Status: DC | PRN

## 2014-03-15 MED ORDER — BUPROPION HCL ER (SMOKING DET) 150 MG PO TB12
150.0000 mg | ORAL_TABLET | Freq: Two times a day (BID) | ORAL | Status: DC
  Administered 2014-03-15 – 2014-03-16 (×2): 150 mg via ORAL
  Filled 2014-03-15 (×3): qty 1

## 2014-03-15 MED ORDER — HYDROCHLOROTHIAZIDE 25 MG PO TABS
25.0000 mg | ORAL_TABLET | Freq: Every day | ORAL | Status: DC
  Administered 2014-03-16: 25 mg via ORAL
  Filled 2014-03-15: qty 1

## 2014-03-15 MED ORDER — LIDOCAINE HCL (CARDIAC) 20 MG/ML IV SOLN
INTRAVENOUS | Status: AC
  Filled 2014-03-15: qty 5

## 2014-03-15 MED ORDER — MENTHOL 3 MG MT LOZG: 1.0000 | LOZENGE | OROMUCOSAL | Status: DC | PRN

## 2014-03-15 MED ORDER — BUPIVACAINE-EPINEPHRINE PF 0.5-1:200000 % IJ SOLN
INTRAMUSCULAR | Status: DC | PRN
  Administered 2014-03-15: 30 mL via PERINEURAL

## 2014-03-15 MED ORDER — CEFAZOLIN SODIUM-DEXTROSE 2-3 GM-% IV SOLR
2.0000 g | Freq: Four times a day (QID) | INTRAVENOUS | Status: AC
  Administered 2014-03-15 – 2014-03-16 (×3): 2 g via INTRAVENOUS
  Filled 2014-03-15 (×3): qty 50

## 2014-03-15 MED ORDER — PROPOFOL 10 MG/ML IV BOLUS
INTRAVENOUS | Status: DC | PRN
  Administered 2014-03-15: 100 mg via INTRAVENOUS

## 2014-03-15 MED ORDER — METOCLOPRAMIDE HCL 10 MG PO TABS
5.0000 mg | ORAL_TABLET | Freq: Three times a day (TID) | ORAL | Status: DC | PRN
Start: 2014-03-15 — End: 2014-03-16

## 2014-03-15 MED ORDER — PHENYLEPHRINE HCL 10 MG/ML IJ SOLN
INTRAMUSCULAR | Status: DC | PRN
  Administered 2014-03-15: 120 ug via INTRAVENOUS
  Administered 2014-03-15 (×3): 80 ug via INTRAVENOUS
  Administered 2014-03-15: 40 ug via INTRAVENOUS

## 2014-03-15 MED ORDER — PHENYLEPHRINE 40 MCG/ML (10ML) SYRINGE FOR IV PUSH (FOR BLOOD PRESSURE SUPPORT)
PREFILLED_SYRINGE | INTRAVENOUS | Status: AC
  Filled 2014-03-15: qty 10

## 2014-03-15 MED ORDER — ATENOLOL 25 MG PO TABS
25.0000 mg | ORAL_TABLET | Freq: Every day | ORAL | Status: DC
  Administered 2014-03-16: 25 mg via ORAL
  Filled 2014-03-15: qty 1

## 2014-03-15 MED ORDER — ASPIRIN EC 325 MG PO TBEC
325.0000 mg | DELAYED_RELEASE_TABLET | Freq: Two times a day (BID) | ORAL | Status: DC
  Administered 2014-03-15 – 2014-03-16 (×2): 325 mg via ORAL
  Filled 2014-03-15 (×4): qty 1

## 2014-03-15 MED ORDER — ONDANSETRON HCL 4 MG PO TABS: 4.0000 mg | ORAL_TABLET | Freq: Four times a day (QID) | ORAL | Status: DC | PRN

## 2014-03-15 MED ORDER — FENTANYL CITRATE 0.05 MG/ML IJ SOLN
INTRAMUSCULAR | Status: AC
  Filled 2014-03-15: qty 5

## 2014-03-15 MED ORDER — ALUMINUM HYDROXIDE GEL 320 MG/5ML PO SUSP
15.0000 mL | ORAL | Status: DC | PRN
  Filled 2014-03-15: qty 30

## 2014-03-15 MED ORDER — ACETAMINOPHEN 650 MG RE SUPP: 650.0000 mg | Freq: Four times a day (QID) | RECTAL | Status: DC | PRN

## 2014-03-15 MED ORDER — NEOSTIGMINE METHYLSULFATE 1 MG/ML IJ SOLN
INTRAMUSCULAR | Status: AC
Start: 2014-03-15 — End: 2014-03-15
  Filled 2014-03-15: qty 10

## 2014-03-15 MED ORDER — EPHEDRINE SULFATE 50 MG/ML IJ SOLN
INTRAMUSCULAR | Status: AC
  Filled 2014-03-15: qty 1

## 2014-03-15 MED ORDER — FLEET ENEMA 7-19 GM/118ML RE ENEM: 1.0000 | ENEMA | Freq: Once | RECTAL | Status: AC | PRN

## 2014-03-15 MED ORDER — ROCURONIUM BROMIDE 50 MG/5ML IV SOLN
INTRAVENOUS | Status: AC
  Filled 2014-03-15: qty 1

## 2014-03-15 MED ORDER — DIPHENHYDRAMINE HCL 12.5 MG/5ML PO ELIX
12.5000 mg | ORAL_SOLUTION | ORAL | Status: DC | PRN
Start: 2014-03-15 — End: 2014-03-16

## 2014-03-15 MED ORDER — METHOCARBAMOL 500 MG PO TABS
500.0000 mg | ORAL_TABLET | Freq: Every day | ORAL | Status: DC | PRN
  Administered 2014-03-16: 500 mg via ORAL
  Filled 2014-03-15: qty 1

## 2014-03-15 MED ORDER — ONDANSETRON HCL 4 MG/2ML IJ SOLN
INTRAMUSCULAR | Status: DC | PRN
  Administered 2014-03-15: 4 mg via INTRAVENOUS

## 2014-03-15 MED ORDER — QUETIAPINE FUMARATE 100 MG PO TABS
100.0000 mg | ORAL_TABLET | Freq: Every day | ORAL | Status: DC
  Filled 2014-03-15 (×2): qty 1

## 2014-03-15 MED ORDER — EPHEDRINE SULFATE 50 MG/ML IJ SOLN
INTRAMUSCULAR | Status: DC | PRN
  Administered 2014-03-15 (×2): 10 mg via INTRAVENOUS
  Administered 2014-03-15: 5 mg via INTRAVENOUS
  Administered 2014-03-15: 10 mg via INTRAVENOUS

## 2014-03-15 MED ORDER — MEPERIDINE HCL 25 MG/ML IJ SOLN: 6.2500 mg | INTRAMUSCULAR | Status: DC | PRN

## 2014-03-15 MED ORDER — LIDOCAINE HCL (CARDIAC) 20 MG/ML IV SOLN
INTRAVENOUS | Status: DC | PRN
  Administered 2014-03-15: 100 mg via INTRAVENOUS

## 2014-03-15 MED ORDER — METOCLOPRAMIDE HCL 5 MG/ML IJ SOLN: 5.0000 mg | Freq: Three times a day (TID) | INTRAMUSCULAR | Status: DC | PRN

## 2014-03-15 MED ORDER — ONDANSETRON HCL 4 MG/2ML IJ SOLN
INTRAMUSCULAR | Status: AC
  Filled 2014-03-15: qty 2

## 2014-03-15 MED ORDER — MIDAZOLAM HCL 2 MG/2ML IJ SOLN
INTRAMUSCULAR | Status: AC
  Filled 2014-03-15: qty 2

## 2014-03-15 MED ORDER — SODIUM CHLORIDE 0.9 % IR SOLN
Status: DC | PRN
  Administered 2014-03-15: 3000 mL

## 2014-03-15 MED ORDER — GLYCOPYRROLATE 0.2 MG/ML IJ SOLN
INTRAMUSCULAR | Status: AC
  Filled 2014-03-15: qty 2

## 2014-03-15 MED ORDER — NEOSTIGMINE METHYLSULFATE 1 MG/ML IJ SOLN
INTRAMUSCULAR | Status: DC | PRN
  Administered 2014-03-15: 3 mg via INTRAVENOUS

## 2014-03-15 MED ORDER — ACETAMINOPHEN 325 MG PO TABS: 650.0000 mg | ORAL_TABLET | Freq: Four times a day (QID) | ORAL | Status: DC | PRN

## 2014-03-15 MED ORDER — OXYCODONE HCL 5 MG/5ML PO SOLN
5.0000 mg | Freq: Once | ORAL | Status: DC | PRN
Start: 2014-03-15 — End: 2014-03-15

## 2014-03-15 MED ORDER — PHENOL 1.4 % MT LIQD: 1.0000 | OROMUCOSAL | Status: DC | PRN

## 2014-03-15 MED ORDER — HYDROMORPHONE HCL PF 1 MG/ML IJ SOLN
0.2500 mg | INTRAMUSCULAR | Status: DC | PRN
  Administered 2014-03-15 (×2): 0.5 mg via INTRAVENOUS

## 2014-03-15 MED ORDER — PANTOPRAZOLE SODIUM 40 MG PO TBEC
40.0000 mg | DELAYED_RELEASE_TABLET | Freq: Every day | ORAL | Status: DC
Start: 2014-03-16 — End: 2014-03-16
  Administered 2014-03-16: 40 mg via ORAL
  Filled 2014-03-15: qty 1

## 2014-03-15 MED ORDER — OXYCODONE-ACETAMINOPHEN 5-325 MG PO TABS
1.0000 | ORAL_TABLET | ORAL | Status: DC | PRN
  Administered 2014-03-15 – 2014-03-16 (×2): 2 via ORAL
  Filled 2014-03-15 (×3): qty 2

## 2014-03-15 MED ORDER — GLYCOPYRROLATE 0.2 MG/ML IJ SOLN
INTRAMUSCULAR | Status: DC | PRN
  Administered 2014-03-15 (×2): 0.4 mg via INTRAVENOUS

## 2014-03-15 MED ORDER — HYDROMORPHONE HCL PF 1 MG/ML IJ SOLN
INTRAMUSCULAR | Status: AC
  Administered 2014-03-15: 0.5 mg via INTRAVENOUS
  Filled 2014-03-15: qty 1

## 2014-03-15 MED ORDER — HYDROCODONE-ACETAMINOPHEN 5-325 MG PO TABS: 1.0000 | ORAL_TABLET | ORAL | Status: DC | PRN

## 2014-03-15 MED ORDER — OXYCODONE HCL 5 MG PO TABS
5.0000 mg | ORAL_TABLET | ORAL | Status: DC | PRN
  Administered 2014-03-15 – 2014-03-16 (×4): 10 mg via ORAL
  Filled 2014-03-15 (×5): qty 2

## 2014-03-15 MED ORDER — SUMATRIPTAN SUCCINATE 50 MG PO TABS
50.0000 mg | ORAL_TABLET | ORAL | Status: DC | PRN
  Filled 2014-03-15: qty 1

## 2014-03-15 MED ORDER — SODIUM CHLORIDE 0.9 % IV SOLN
INTRAVENOUS | Status: DC
  Administered 2014-03-15: 14:00:00 via INTRAVENOUS

## 2014-03-15 MED ORDER — AMLODIPINE BESYLATE 10 MG PO TABS
10.0000 mg | ORAL_TABLET | Freq: Every day | ORAL | Status: DC
  Administered 2014-03-16: 10 mg via ORAL
  Filled 2014-03-15: qty 1

## 2014-03-15 MED ORDER — PROPOFOL 10 MG/ML IV BOLUS
INTRAVENOUS | Status: AC
  Filled 2014-03-15: qty 20

## 2014-03-15 MED ORDER — SODIUM CHLORIDE 0.9 % IJ SOLN
INTRAMUSCULAR | Status: AC
  Filled 2014-03-15: qty 10

## 2014-03-15 MED ORDER — ROCURONIUM BROMIDE 100 MG/10ML IV SOLN
INTRAVENOUS | Status: DC | PRN
  Administered 2014-03-15: 50 mg via INTRAVENOUS

## 2014-03-15 MED ORDER — DOCUSATE SODIUM 100 MG PO CAPS
100.0000 mg | ORAL_CAPSULE | Freq: Two times a day (BID) | ORAL | Status: DC
  Administered 2014-03-15 – 2014-03-16 (×2): 100 mg via ORAL
  Filled 2014-03-15 (×3): qty 1

## 2014-03-15 MED ORDER — HYDRALAZINE HCL 50 MG PO TABS
50.0000 mg | ORAL_TABLET | Freq: Every day | ORAL | Status: DC
  Administered 2014-03-16: 50 mg via ORAL
  Filled 2014-03-15: qty 1

## 2014-03-15 MED ORDER — MORPHINE SULFATE 2 MG/ML IJ SOLN
1.0000 mg | INTRAMUSCULAR | Status: DC | PRN
  Administered 2014-03-15 (×3): 1 mg via INTRAVENOUS
  Filled 2014-03-15 (×3): qty 1

## 2014-03-15 MED ORDER — ONDANSETRON HCL 4 MG/2ML IJ SOLN: 4.0000 mg | Freq: Once | INTRAMUSCULAR | Status: DC | PRN

## 2014-03-15 MED ORDER — ONDANSETRON HCL 4 MG/2ML IJ SOLN
4.0000 mg | Freq: Four times a day (QID) | INTRAMUSCULAR | Status: DC | PRN
  Administered 2014-03-16: 4 mg via INTRAVENOUS
  Filled 2014-03-15: qty 2

## 2014-03-15 MED ORDER — MIDAZOLAM HCL 5 MG/5ML IJ SOLN
INTRAMUSCULAR | Status: DC | PRN
  Administered 2014-03-15: 2 mg via INTRAVENOUS

## 2014-03-15 MED ORDER — LACTATED RINGERS IV SOLN
INTRAVENOUS | Status: DC | PRN
  Administered 2014-03-15 (×3): via INTRAVENOUS

## 2014-03-15 MED ORDER — FENTANYL CITRATE 0.05 MG/ML IJ SOLN
INTRAMUSCULAR | Status: DC | PRN
  Administered 2014-03-15 (×5): 50 ug via INTRAVENOUS

## 2014-03-15 SURGICAL SUPPLY — 61 items
ASSEMBLY NECK TAPER FIXED 135 (Orthopedic Implant) ×3 IMPLANT
BLADE SAW SAG 73X25 THK (BLADE)
BLADE SAW SGTL 73X25 THK (BLADE) IMPLANT
BLADE SURG 15 STRL LF DISP TIS (BLADE) ×2 IMPLANT
BLADE SURG 15 STRL SS (BLADE) ×4
BOWL SMART MIX CTS (DISPOSABLE) ×3 IMPLANT
CHLORAPREP W/TINT 26ML (MISCELLANEOUS) ×6 IMPLANT
CLOSURE WOUND 1/2 X4 (GAUZE/BANDAGES/DRESSINGS) ×1
COVER SURGICAL LIGHT HANDLE (MISCELLANEOUS) ×3 IMPLANT
DRAPE INCISE IOBAN 66X45 STRL (DRAPES) ×3 IMPLANT
DRAPE SURG 17X23 STRL (DRAPES) ×3 IMPLANT
DRAPE U-SHAPE 47X51 STRL (DRAPES) ×3 IMPLANT
DRSG MEPILEX BORDER 4X4 (GAUZE/BANDAGES/DRESSINGS) ×3 IMPLANT
DRSG MEPILEX BORDER 4X8 (GAUZE/BANDAGES/DRESSINGS) ×3 IMPLANT
DRSG PAD ABDOMINAL 8X10 ST (GAUZE/BANDAGES/DRESSINGS) IMPLANT
ELECT BLADE 4.0 EZ CLEAN MEGAD (MISCELLANEOUS)
ELECT CAUTERY BLADE 6.4 (BLADE) ×3 IMPLANT
ELECT REM PT RETURN 9FT ADLT (ELECTROSURGICAL) ×3
ELECTRODE BLDE 4.0 EZ CLN MEGD (MISCELLANEOUS) IMPLANT
ELECTRODE REM PT RTRN 9FT ADLT (ELECTROSURGICAL) ×1 IMPLANT
EVACUATOR 1/8 PVC DRAIN (DRAIN) ×3 IMPLANT
GLOVE BIO SURGEON STRL SZ7 (GLOVE) ×3 IMPLANT
GLOVE BIO SURGEON STRL SZ7.5 (GLOVE) ×3 IMPLANT
GLOVE BIOGEL PI IND STRL 8 (GLOVE) ×1 IMPLANT
GLOVE BIOGEL PI INDICATOR 8 (GLOVE) ×2
GOWN STRL REUS W/ TWL LRG LVL3 (GOWN DISPOSABLE) ×3 IMPLANT
GOWN STRL REUS W/TWL LRG LVL3 (GOWN DISPOSABLE) ×6
HANDPIECE INTERPULSE COAX TIP (DISPOSABLE) ×2
HEAD HUMERAL STND 48X15MM (Head) ×3 IMPLANT
HEAD HUMERALSTD 48X15MM (Head) ×1 IMPLANT
HEMOSTAT SURGICEL 2X14 (HEMOSTASIS) IMPLANT
HOOD PEEL AWAY FACE SHEILD DIS (HOOD) ×6 IMPLANT
KIT BASIN OR (CUSTOM PROCEDURE TRAY) ×3 IMPLANT
KIT ROOM TURNOVER OR (KITS) ×3 IMPLANT
MANIFOLD NEPTUNE II (INSTRUMENTS) ×3 IMPLANT
NEEDLE HYPO 25GX1X1/2 BEV (NEEDLE) ×3 IMPLANT
NEEDLE MAYO TROCAR (NEEDLE) ×3 IMPLANT
NS IRRIG 1000ML POUR BTL (IV SOLUTION) ×3 IMPLANT
PACK SHOULDER (CUSTOM PROCEDURE TRAY) ×3 IMPLANT
PAD ARMBOARD 7.5X6 YLW CONV (MISCELLANEOUS) ×6 IMPLANT
RETRIEVER SUT HEWSON (MISCELLANEOUS) ×3 IMPLANT
SET HNDPC FAN SPRY TIP SCT (DISPOSABLE) ×1 IMPLANT
SLING ARM IMMOBILIZER LRG (SOFTGOODS) ×3 IMPLANT
SLING ARM IMMOBILIZER MED (SOFTGOODS) IMPLANT
SPONGE GAUZE 4X4 12PLY (GAUZE/BANDAGES/DRESSINGS) ×3 IMPLANT
SPONGE LAP 18X18 X RAY DECT (DISPOSABLE) ×3 IMPLANT
STEM GLOBAL AP 12MM (Stem) ×3 IMPLANT
STRIP CLOSURE SKIN 1/2X4 (GAUZE/BANDAGES/DRESSINGS) ×2 IMPLANT
SUPPORT WRAP ARM LG (MISCELLANEOUS) ×3 IMPLANT
SUT ETHIBOND NAB CT1 #1 30IN (SUTURE) ×3 IMPLANT
SUT FIBERWIRE #2 38 T-5 BLUE (SUTURE) ×9
SUT MNCRL AB 4-0 PS2 18 (SUTURE) ×3 IMPLANT
SUT VIC AB 0 CTB1 27 (SUTURE) IMPLANT
SUT VIC AB 2-0 CT1 27 (SUTURE) ×4
SUT VIC AB 2-0 CT1 TAPERPNT 27 (SUTURE) ×2 IMPLANT
SUTURE FIBERWR #2 38 T-5 BLUE (SUTURE) ×3 IMPLANT
SYR CONTROL 10ML LL (SYRINGE) IMPLANT
TOWEL OR 17X24 6PK STRL BLUE (TOWEL DISPOSABLE) ×3 IMPLANT
TOWEL OR 17X26 10 PK STRL BLUE (TOWEL DISPOSABLE) ×3 IMPLANT
TRAY FOLEY CATH 16FRSI W/METER (SET/KITS/TRAYS/PACK) IMPLANT
WATER STERILE IRR 1000ML POUR (IV SOLUTION) ×3 IMPLANT

## 2014-03-15 NOTE — Progress Notes (Signed)
No notes received from the [facility]....DA

## 2014-03-15 NOTE — Transfer of Care (Signed)
Immediate Anesthesia Transfer of Care Note  Patient: Frederick Roberts  Procedure(s) Performed: Procedure(s) with comments: SHOULDER HEMI-ARTHROPLASTY (Right) - Rigth shoulder hemiarthroplasty  Patient Location: PACU  Anesthesia Type:General and GA combined with regional for post-op pain  Level of Consciousness: awake, alert , oriented and patient cooperative  Airway & Oxygen Therapy: Patient Spontanous Breathing and Patient connected to nasal cannula oxygen  Post-op Assessment: Report given to PACU RN, Post -op Vital signs reviewed and stable and Patient moving all extremities  Post vital signs: Reviewed and stable  Complications: No apparent anesthesia complications

## 2014-03-15 NOTE — Discharge Instructions (Signed)
Discharge Instructions after Total Shoulder Arthroplasty ° ° °A sling has been provided for you. Remove the sling 5 times each day to perform motion exercises. After the first 48 to 72 hours, discontinue using the sling. You should use the sling as a protective device, if you are in a crowd.  °Use ice on the shoulder intermittently over the first 48 hours after surgery.  °Pain medication has been prescribed for you.  °Use your medication liberally over the first 48 hours, and then begin to taper your use. You may take Extra Strength Tylenol or Tylenol only in place of the pain pills. DO NOT take ANY nonsteroidal anti-inflammatory pain medications: Advil, Motrin, Ibuprofen, Aleve, Naproxen, or Naprosyn. °Take one aspirin a day for 2 weeks after surgery, unless you have an aspirin sensitivity/allergy or asthma. °You may remove your dressing after two days.  °You may shower 5 days after surgery. The incision CANNOT get wet prior to 5 days. Simply allow the water to wash over the site and then pat dry. Do not rub the incision. Make sure your axilla (armpit) is completely dry after showering.  °Active reaching and lifting are not permitted. You may use the operative arm for activities of daily living that do not require the operative arm to leave the side of the body, such as eating, drinking, bathing, etc.  °Three to 5 times each day you should perform assisted overhead reaching and external rotation (outward turning) exercises with the operative arm. You were taught these exercises prior to discharge. Both exercises should be done with the non-operative arm used as the "therapist arm" while the operative arm remains relaxed. Ten of each exercise should be done three to five times each day. ° ° °Overhead reach is helping to lift your stiff arm up as high as it will go. To stretch your overhead reach, lie flat on your back, relax, and grasp the wrist of the tight shoulder with your opposite hand. Using the power in your  opposite arm, bring the stiff arm up as far as it is comfortable. Start holding it for ten seconds and then work up to where you can hold it for a count of 30. Breathe slowly and deeply while the arm is moved. Repeat this stretch ten times, trying to help the ar up a little higher each time.  ° ° ° °External rotation is turning the arm out to the side while your elbow stays close to your body. External rotation is best stretched while you are lying on your back. Hold a cane, yardstick, broom handle, or dowel in both hands. Bend both elbows to a right angle. Use steady, gentle force from your normal arm to rotate the hand of the stiff shoulder out away from your body. Continue the rotation as far as it will go comfortably, holding it there for a count of 10. Repeat this exercise ten times.  ° ° ° ° °Please call [phone] during normal business hours or [phone] after hours for any problems. Including the following: ° °- excessive redness of the incisions °- drainage for more than 4 days °- fever of more than 101.5 F ° °*Please note that pain medications will not be refilled after hours or on weekends. ° ° ° °

## 2014-03-15 NOTE — Anesthesia Preprocedure Evaluation (Signed)
Anesthesia Evaluation  Patient identified by MRN, date of birth, ID band Patient awake    Reviewed: Allergy & Precautions, H&P , NPO status , Patient's Chart, lab work & pertinent test results  Airway Mallampati: I TM Distance: >3 FB Neck ROM: Full    Dental   Pulmonary asthma , sleep apnea , Current Smoker,          Cardiovascular hypertension, Pt. on medications     Neuro/Psych Depression    GI/Hepatic GERD-  Medicated and Controlled,  Endo/Other    Renal/GU      Musculoskeletal   Abdominal   Peds  Hematology   Anesthesia Other Findings   Reproductive/Obstetrics                           Anesthesia Physical Anesthesia Plan  ASA: III  Anesthesia Plan: General   Post-op Pain Management:    Induction: Intravenous  Airway Management Planned: Oral ETT  Additional Equipment:   Intra-op Plan:   Post-operative Plan: Extubation in OR  Informed Consent: I have reviewed the patients History and Physical, chart, labs and discussed the procedure including the risks, benefits and alternatives for the proposed anesthesia with the patient or authorized representative who has indicated his/her understanding and acceptance.     Plan Discussed with: CRNA and Surgeon  Anesthesia Plan Comments:         Anesthesia Quick Evaluation

## 2014-03-15 NOTE — Anesthesia Procedure Notes (Signed)
Anesthesia Regional Block:  Interscalene brachial plexus block  Pre-Anesthetic Checklist: ,, timeout performed, Correct Patient, Correct Site, Correct Laterality, Correct Procedure, Correct Position, site marked, Risks and benefits discussed,  Surgical consent,  Pre-op evaluation,  At surgeon's request and post-op pain management  Laterality: Right  Prep: chloraprep       Needles:  Injection technique: Single-shot  Needle Type: Echogenic Stimulator Needle     Needle Length: 5cm 5 cm Needle Gauge: 21 and 21 G    Additional Needles:  Procedures: ultrasound guided (picture in chart) and nerve stimulator Interscalene brachial plexus block  Nerve Stimulator or Paresthesia:  Response: 0.4 mA,   Additional Responses:   Narrative:  Start time: 03/15/2014 7:10 AM End time: 03/15/2014 7:20 AM Injection made incrementally with aspirations every 5 mL.  Performed by: Personally  Anesthesiologist: Arta BruceKevin Alicyn Klann MD  Additional Notes: Monitors applied. Patient sedated. Sterile prep and drape,hand hygiene and sterile gloves were used. Relevant anatomy identified.Needle position confirmed.Local anesthetic injected incrementally after negative aspiration. Local anesthetic spread visualized around nerve(s). Vascular puncture avoided. No complications. Image printed for medical record.The patient tolerated the procedure well.

## 2014-03-15 NOTE — Anesthesia Postprocedure Evaluation (Signed)
Anesthesia Post Note  Patient: Frederick Roberts  Procedure(s) Performed: Procedure(s) (LRB): SHOULDER HEMI-ARTHROPLASTY (Right)  Anesthesia type: general  Patient location: PACU  Post pain: Pain level controlled  Post assessment: Patient's Cardiovascular Status Stable  Last Vitals:  Filed Vitals:   03/15/14 1111  BP: 130/81  Pulse: 58  Temp:   Resp: 16    Post vital signs: Reviewed and stable  Level of consciousness: sedated  Complications: No apparent anesthesia complications

## 2014-03-15 NOTE — Op Note (Signed)
Procedure(s): SHOULDER HEMI-ARTHROPLASTY Procedure Note  Frederick Roberts male 43 y.o. 03/15/2014  Procedure(s) and Anesthesia Type:   Right SHOULDER HEMI-ARTHROPLASTY -  Surgeon(s) and Role:    * Mable ParisJustin William Abelina Ketron, MD - Primary   Indications:  43 y.o. male  who has had multiple previous surgeries on his right shoulder. He went on to have worsening pain over the last few years which has been treated with intra-articular injections, medications, and arthroscopic debridement without relief of his discomfort. Pain interfered with daily activities and sleep and quality of life. He wished to go forward with surgery to try and decrease pain and increase function.     Surgeon: Mable ParisJustin William Tinna Kolker   Assistants: Damita Lackanielle Lalibert PA-C [facility](Danielle was present and scrubbed throughout the procedure and was essential in positioning, retraction, exposure, and closure)  Anesthesia: General endotracheal anesthesia with preoperative interscalene block     Procedure Detail  SHOULDER HEMI-ARTHROPLASTY  Findings: DePuy proximally porous coated AP stem size 12, 48 x 15 centered head. Glenoid cartilage was intact as was the labrum. He had moderate sized humeral head osteophytes and some geographic cartilage loss on the humeral head. Preoperative range of motion was approximately 140 forward flexion, 40 of external rotation.  Estimated Blood Loss:  200 mL         Drains: 1 medium hemovac  Blood Given: none          Specimens: none        Complications:  * No complications entered in OR log *         Disposition: PACU - hemodynamically stable.         Condition: stable    Procedure:   The patient was identified in the preoperative holding area where I personally marked the operative extremity after verifying with the patient and consent. He  was taken to the operating room where He was transferred to the   operative table.  The patient received an interscalene block in   the  holding area by the attending anesthesiologist.  General anesthesia was induced   in the operating room without complication.  The patient did receive IV  Ancef prior to the commencement of the procedure.  The patient was   placed in the beach-chair position with the back raised about 30   degrees.  The nonoperative extremity and head and neck were carefully   positioned and padded protecting against neurovascular compromise.  The   left upper extremity was then prepped and draped in the standard sterile   fashion.    The appropriate operative time-out was performed with   Anesthesia, the perioperative staff, as well as myself and we all agreed   that the right side was the correct operative site.  An approximately   10 cm incision was made from the tip of the coracoid to the center point of the   humerus at the level of the axilla.  Dissection was carried down sharply   through subcutaneous tissues and cephalic vein was identified and taken   laterally with the deltoid.  The pectoralis major was taken medially.  The   upper 1 cm of the pectoralis major was released from its attachment on   the humerus.  The clavipectoral fascia was incised just lateral to the   conjoined tendon.  This incision was carried up to but not into the   coracoacromial ligament.  Digital palpation was used to prove   integrity of the axillary nerve which was protected throughout  the   procedure.  Musculocutaneous nerve was not palpated in the operative   field.  Conjoined tendon was then retracted gently medially and the   deltoid laterally.  Anterior circumflex humeral vessels were clamped and   coagulated.  The biceps tendon was noted to be absent. The bicipital groove was exposed.  An osteotomy was performed at the lesser tuberosity and the   subscapularis was kept in continuity with the underlying capsule.  Capsule was then   released all the way down to the 6 o'clock position of the humeral head.   The  humeral head was then delivered with simultaneous adduction,   extension and external rotation.  All humeral osteophytes were removed   and the anatomic neck of the humerus was marked and cut free hand at   approximately 25 degrees retroversion within about 3 mm of the cuff   reflection posteriorly.  The head size was estimated to be a 48 medium   offset.  At that point, the humeral head was retracted posteriorly with   a Fukuda retractor and the glenoid was carefully examined. Cartilage was noted to be intact. There was a small suture where the previous anterior labral repair was carried out. The labrum was intact and well healed. It was very healthy appearing with no significant fraying circumferentially. The long head of the biceps was noted to be absent. Given the lack of significant stiffness I did not perform a significant capsular release or resection of the labrum.  The proximal humerus was then again exposed.    The humerus was then sequentially reamed going from 6 to 12 by 2 mm incriments. The 12 mm reamer was found to have appropriate cortical contact.  A   box osteotome was then used and a 12-mm broach.  The broach handle was   removed and the trial head was placed.   Calcar reamer was used.The standard 48 x 15 head fit best.  With the trial implantation of the component, there was   Appropriate soft tissue tension. With external rotation there was a slight click. Hip was redislocated and the posterior osteophytes were removed. This resolved the click. Range of motion was then smooth. .  With forward elevation, there was no tendency   towards posterior subluxation.   The trial was removed and the final implant was prepared on a back table.  The trial was removed and the final implant was prepared on a back table.   Small holes were drilled on both sides of the lesser tuberosity osteotomy, through which 3 Fibertapes were passed. The implant was then placed through the loop of all 3 Fibertapes  and impacted with an excellent press-fit. This achieved excellent anatomic reconstruction of the proximal humerus.  The joint was then copiously irrigated with pulse lavage.  The subscapularis and   lesser tuberosity osteotomy were then repaired using the 3 Fibertapes previously passed.   One #1 Ethibond was placed at the rotator interval just above   the lesser tuberosity. After repair of the lesser tuberosity, a medium   Hemovac was placed out anterolaterally and again copious irrigation was   used.   Skin was closed with 2-0 Vicryl sutures in the deep dermal layer and 4-0 Monocryl in a subcuticular  running fashion.  Sterile dressings were then applied including Steri- Strips, 4x4s, ABDs and tape.  The patient was placed in a sling and allowed to awaken from general anesthesia and taken to the recovery room in stable  condition.  POSTOPERATIVE PLAN:  Early passive range of motion will be allowed with the goal of 40 degrees external rotation and 140 degrees forward elevation.  No internal rotation at this time.  No active motion of the arm until the lesser tuberosity heals.  The patient will likely be kept in the hospital for 1-2 days and then discharged home.

## 2014-03-15 NOTE — Progress Notes (Signed)
Utilization review completed.  

## 2014-03-15 NOTE — H&P (Signed)
Frederick Roberts is an 43 y.o. male.   Chief Complaint: R shoulder pain and dsyfunction HPI: S/p multiple prior R shoulder surgeries with severe humeral head arthropathy.  Failed conservative treatment with injections, medications, activity modification.  Past Medical History  Diagnosis Date  . Hypertension   . Headache(784.0)     migraines  . Sleep apnea     "sometimes" uses CPAP  . Asthma     as a child  . Depression     hx of suicidal thoughts and one time attempt  . PTSD (post-traumatic stress disorder)   . GERD (gastroesophageal reflux disease)     takes Prilosec  . Arthritis     Past Surgical History  Procedure Laterality Date  . Shoulder surgery Right   . Nasal septum surgery    . Appendectomy    . Vasectomy    . Colonoscopy    . Esophagogastroduodenoscopy endoscopy    . Wrist fracture surgery Right 1993  . Bunionectomy Right   . Wisdom tooth extraction      History reviewed. No pertinent family history. Social History:  reports that he has been smoking Cigarettes.  He has been smoking about 0.50 packs per day. He has never used smokeless tobacco. He reports that he drinks alcohol. He reports that he does not use illicit drugs.  Allergies: No Known Allergies  Medications Prior to Admission  Medication Sig Dispense Refill  . amLODipine (NORVASC) 10 MG tablet Take 10 mg by mouth daily.      Marland Kitchen. atenolol (TENORMIN) 25 MG tablet Take 25 mg by mouth daily.      Marland Kitchen. buPROPion (ZYBAN) 150 MG 12 hr tablet Take 150 mg by mouth 2 (two) times daily.      . hydrALAZINE (APRESOLINE) 25 MG tablet Take 50 mg by mouth daily.      . hydrochlorothiazide (HYDRODIURIL) 25 MG tablet Take 25 mg by mouth daily.      . meloxicam (MOBIC) 15 MG tablet Take 15 mg by mouth daily as needed for pain.      . methocarbamol (ROBAXIN) 500 MG tablet Take 500 mg by mouth daily as needed for muscle spasms.      Marland Kitchen. omeprazole (PRILOSEC) 20 MG capsule Take 20 mg by mouth daily.      . QUEtiapine (SEROQUEL)  100 MG tablet Take 100 mg by mouth at bedtime.      . SUMAtriptan (IMITREX) 50 MG tablet Take 50 mg by mouth every 2 (two) hours as needed for migraine or headache. May repeat in 2 hours if headache persists or recurs.      . traMADol (ULTRAM) 50 MG tablet Take 50 mg by mouth 3 (three) times daily as needed (for pain).        No results found for this or any previous visit (from the past 48 hour(s)). No results found.  Review of Systems  All other systems reviewed and are negative.   Blood pressure 156/108, pulse 58, resp. rate 16, SpO2 100.00%. Physical Exam  Constitutional: He is oriented to person, place, and time. He appears well-developed and well-nourished.  HENT:  Head: Atraumatic.  Eyes: EOM are normal.  Cardiovascular: Intact distal pulses.   Respiratory: Effort normal.  Musculoskeletal:  R shoulder pain with decreased ROM. NVID.  Neurological: He is alert and oriented to person, place, and time.  Skin: Skin is warm and dry.  Psychiatric: He has a normal mood and affect.     Assessment/Plan R shoulder post dislocation  arthropathy Plan R shoulder hemiarthroplasty Risks / benefits of surgery discussed Consent on chart  NPO for OR Preop antibiotics   Mable ParisJustin William Steffany Schoenfelder 03/15/2014, 7:16 AM

## 2014-03-16 LAB — CBC
HEMATOCRIT: 36.4 % — AB (ref 39.0–52.0)
Hemoglobin: 12.6 g/dL — ABNORMAL LOW (ref 13.0–17.0)
MCH: 31.7 pg (ref 26.0–34.0)
MCHC: 34.6 g/dL (ref 30.0–36.0)
MCV: 91.5 fL (ref 78.0–100.0)
Platelets: 154 10*3/uL (ref 150–400)
RBC: 3.98 MIL/uL — ABNORMAL LOW (ref 4.22–5.81)
RDW: 13.2 % (ref 11.5–15.5)
WBC: 8.9 10*3/uL (ref 4.0–10.5)

## 2014-03-16 NOTE — Discharge Summary (Signed)
Patient ID: Frederick Roberts MRN: [id number] DOB/AGE: 42/12/1970 42 y.o.  Admit date: 03/15/2014 Discharge date: 03/16/2014  Admission Diagnoses:  Active Problems:   Arthritis of shoulder region   Discharge Diagnoses:  Same  Past Medical History  Diagnosis Date  . Hypertension   . Asthma     as a child  . Depression     hx of suicidal thoughts and one time attempt  . PTSD (post-traumatic stress disorder)   . OSA on CPAP     "sometimes" uses CPAP  . Sickle cell trait   . GERD (gastroesophageal reflux disease)     takes Prilosec  . Headache(784.0)     "2-3 times/wk" (03/15/2014)  . Migraines     "@ least once/wk" (03/15/2014)  . Arthritis     "joints" (03/15/2014)  . Anxiety     Surgeries: Procedure(s): SHOULDER HEMI-ARTHROPLASTY on 03/15/2014   Consultants:    Discharged Condition: Improved  Hospital Course: Frederick Roberts is an 43 y.o. male who was admitted 03/15/2014 for operative right shoulder pain with history of multiple previous surgeries on his right shoulder. He went on to have worsening pain over the last few years which has been treated with intra-articular injections, medications, and arthroscopic debridement without relief of his discomfort. Pain interfered with daily activities and sleep and quality of life. He wished to go forward with surgery to try and decrease pain and increase function.After pre-op clearance the patient was taken to the operating room on 03/15/2014 and underwent  Procedure(s): SHOULDER HEMI-ARTHROPLASTY.    Patient was given perioperative antibiotics: Anti-infectives   Start     Dose/Rate Route Frequency Ordered Stop   03/15/14 1530  ceFAZolin (ANCEF) IVPB 2 g/50 mL premix     2 g 100 mL/hr over 30 Minutes Intravenous Every 6 hours 03/15/14 1242 03/16/14 0414   03/15/14 0600  ceFAZolin (ANCEF) IVPB 2 g/50 mL premix     2 g 100 mL/hr over 30 Minutes Intravenous On call to O.R. 03/14/14 1416 03/15/14 0805       Patient was given sequential  compression devices, early ambulation, and ASA 325mg  BID to prevent DVT.  Patient benefited maximally from hospital stay and there were no complications.    Recent vital signs: Patient Vitals for the past 24 hrs:  BP Temp Pulse Resp SpO2 Height Weight  03/16/14 0541 114/65 mmHg 98.9 F (37.2 C) 64 18 94 % - -  03/15/14 2100 - - - - - 5\' 11"  (1.803 m) 81.647 kg (180 lb)  03/15/14 2009 127/59 mmHg 99.1 F (37.3 C) 56 16 94 % - -  03/15/14 1607 123/76 mmHg 98.1 F (36.7 C) 59 18 97 % - -  03/15/14 1230 127/85 mmHg 97.9 F (36.6 C) 57 16 99 % - -  03/15/14 1215 - - 58 18 99 % - -  03/15/14 1211 134/89 mmHg - 58 19 98 % - -  03/15/14 1200 - 97.8 F (36.6 C) 62 21 97 % - -  03/15/14 1155 129/92 mmHg - 63 19 99 % - -  03/15/14 1145 - - 57 14 100 % - -  03/15/14 1141 129/88 mmHg - 57 17 100 % - -  03/15/14 1130 - - 58 15 100 % - -  03/15/14 1126 133/80 mmHg - 63 18 100 % - -  03/15/14 1115 - - 73 20 99 % - -  03/15/14 1111 130/81 mmHg - 58 16 99 % - -  03/15/14 1100 - - 60  19 99 % - -  03/15/14 1058 129/89 mmHg - 59 18 99 % - -  03/15/14 1045 - - 64 15 98 % - -  03/15/14 1030 131/86 mmHg - 69 18 100 % - -  03/15/14 1015 132/83 mmHg - 74 21 97 % - -  03/15/14 1009 128/73 mmHg 97.6 F (36.4 C) 78 18 98 % - -     Recent laboratory studies:  Recent Labs  03/16/14 0650  WBC 8.9  HGB 12.6*  HCT 36.4*  PLT 154     Discharge Medications:     Medication List    ASK your doctor about these medications       amLODipine 10 MG tablet  Commonly known as:  NORVASC  Take 10 mg by mouth daily.     atenolol 25 MG tablet  Commonly known as:  TENORMIN  Take 25 mg by mouth daily.     buPROPion 150 MG 12 hr tablet  Commonly known as:  ZYBAN  Take 150 mg by mouth 2 (two) times daily.     hydrALAZINE 25 MG tablet  Commonly known as:  APRESOLINE  Take 50 mg by mouth daily.     hydrochlorothiazide 25 MG tablet  Commonly known as:  HYDRODIURIL  Take 25 mg by mouth daily.      meloxicam 15 MG tablet  Commonly known as:  MOBIC  Take 15 mg by mouth daily as needed for pain.     methocarbamol 500 MG tablet  Commonly known as:  ROBAXIN  Take 500 mg by mouth daily as needed for muscle spasms.     omeprazole 20 MG capsule  Commonly known as:  PRILOSEC  Take 20 mg by mouth daily.     QUEtiapine 100 MG tablet  Commonly known as:  SEROQUEL  Take 100 mg by mouth at bedtime.     SUMAtriptan 50 MG tablet  Commonly known as:  IMITREX  Take 50 mg by mouth every 2 (two) hours as needed for migraine or headache. May repeat in 2 hours if headache persists or recurs.     traMADol 50 MG tablet  Commonly known as:  ULTRAM  Take 50 mg by mouth 3 (three) times daily as needed (for pain).        Diagnostic Studies: Dg Chest 2 View  03/02/2014   CLINICAL DATA:  Preop right shoulder surgery.  Arthritis.  EXAM: CHEST  2 VIEW  COMPARISON:  12/1910  FINDINGS: The cardiomediastinal silhouette is within normal limits. The patient has taken a slightly shallower inspiration compared to the prior radiographs. The lungs are clear. There is no pleural effusion or pneumothorax. Degenerative changes are partially visualized in the right shoulder.  IMPRESSION: No active cardiopulmonary disease.   Electronically Signed   By: Sebastian AcheAllen  Grady   On: 03/02/2014 12:16   Dg Shoulder Right Port  03/15/2014   CLINICAL DATA:  Post arthroplasty  EXAM: PORTABLE RIGHT SHOULDER - 1+ VIEW  COMPARISON:  None.  FINDINGS: Single AP view of the right shoulder submitted. There is right shoulder prosthesis in anatomic alignment.  IMPRESSION: Right shoulder prosthesis anatomic alignment.   Electronically Signed   By: Natasha MeadLiviu  Pop M.D.   On: 03/15/2014 11:05    Disposition: 01-Home or Self Care        Follow-up Information   Follow up with Mable ParisHANDLER,JUSTIN WILLIAM, MD. Schedule an appointment as soon as possible for a visit in 2 weeks.   Specialty:  Orthopedic Surgery  Contact information:   [address] [place] [address] [phone]        Signed: Jiles Harold 03/16/2014, 8:03 AM

## 2014-03-16 NOTE — Progress Notes (Signed)
Occupational Therapy Evaluation and Discharge Patient Details Name: Frederick Roberts MRN: [id number] DOB: 03/21/1971 Today's Date: 03/16/2014    History of Present Illness Pt is 43 yo Male s/p R shoulder hemi-arthroplasty   Clinical Impression   PTA pt lived at home alone and was independent with ADLs and functional mobility. Education and training provided for compensatory techniques for UB ADLs included gravity assisted ROM, sling management, and positioning of RUE. Education and training provided for R shoulder precautions and exercise protocol. Pt stated multiple times, "you know I just had surgery yesterday, right?" and reiterated when asked to perform therapeutic exercise. This OT is concerned about pt's ability to continue therapeutic exercise at home independently as ordered by MD. Pt has no PT needs at this time and will d/c home today. [facility] and adequate shoulder PROM.     Follow Up Recommendations  Home health OT;Supervision/Assistance - 24 hour    Equipment Recommendations  None recommended by OT       Precautions / Restrictions Precautions Precautions: Shoulder Type of Shoulder Precautions: No AROM of shoulder- Chandler protocol Shoulder Interventions: Shoulder sling/immobilizer Precaution Booklet Issued: Yes (comment) Precaution Comments: Educated pt on precautions and gravity assisted ROM for UB ADLs Required Braces or Orthoses: Sling Restrictions Weight Bearing Restrictions: Yes RUE Weight Bearing: Non weight bearing      Mobility Bed Mobility Overal bed mobility: Modified Independent                Transfers Overall transfer level: Modified independent Equipment used: None                  Balance Overall balance assessment: No apparent balance deficits (not formally assessed)                                          ADL Overall ADL's : Needs assistance/impaired Eating/Feeding:  Set up;Sitting   Grooming: Set up;Sitting   Upper Body Bathing: Sitting;Minimal assitance   Lower Body Bathing: Set up;Sit to/from stand   Upper Body Dressing : Minimal assistance;Sitting;Adhering to UE precautions   Lower Body Dressing: Set up;Sit to/from stand   Toilet Transfer: Supervision/safety;Ambulation (sit<>stand)   Toileting- Clothing Manipulation and Hygiene: Supervision/safety;Sit to/from stand   Tub/ Shower Transfer: Supervision/safety;Tub transfer;Ambulation Tub/Shower Transfer Details (indicate cue type and reason): educated provided to wear sling into shower and remove once safely transferred.  Functional mobility during ADLs: Modified independent       Vision  Per pt report, no change from baseline.                           Pertinent Vitals/Pain Pt does not c/o pain this date, however seemed to be fearful of causing pain through therapeutic exercise.      Hand Dominance Right   Extremity/Trunk Assessment Upper Extremity Assessment Upper Extremity Assessment: RUE deficits/detail RUE Deficits / Details: No shoulder AROM RUE: Unable to fully assess due to pain;Unable to fully assess due to immobilization RUE Coordination: decreased fine motor;decreased gross motor   Lower Extremity Assessment Lower Extremity Assessment: Overall WFL for tasks assessed   Cervical / Trunk Assessment Cervical / Trunk Assessment: Normal   Communication Communication Communication: No difficulties   Cognition Arousal/Alertness: Awake/alert Behavior During Therapy: WFL for tasks assessed/performed Overall Cognitive Status: Within Functional Limits for tasks  assessed                        Exercises Exercises: Shoulder     Shoulder Instructions Shoulder Instructions Donning/doffing shirt without moving shoulder: Minimal assistance Method for sponge bathing under operated UE: Supervision/safety Donning/doffing sling/immobilizer: Minimal  assistance Correct positioning of sling/immobilizer: Supervision/safety ROM for elbow, wrist and digits of operated UE: Supervision/safety Sling wearing schedule (on at all times/off for ADL's): Supervision/safety Proper positioning of operated UE when showering: Supervision/safety Positioning of UE while sleeping: Supervision/safety    Home Living Family/patient expects to be discharged to:: Private residence Living Arrangements: Alone Available Help at Discharge: Available PRN/intermittently;Other (Comment) (Pt stated he would have some help PRN but did not clarify wh) Type of Home: Apartment Home Access: Level entry     Home Layout: Two level     Bathroom Shower/Tub: Tub/shower unit Shower/tub characteristics: Engineer, building servicesCurtain Bathroom Toilet: Standard     Home Equipment: None          Prior Functioning/Environment Level of Independence: Independent                                       End of Session Equipment Utilized During Treatment: Other (comment) (sling) Activity Tolerance: Patient tolerated treatment well Patient left: in bed;with call bell/phone within reach   Time: 0847-0922 OT Time Calculation (min): 35 min Charges:  OT General Charges $OT Visit: 1 Procedure OT Evaluation $Initial OT Evaluation Tier I: 1 Procedure OT Treatments $Self Care/Home Management : 8-22 mins $Therapeutic Exercise: 8-22 mins  Rae LipsLeeann M Jemima Petko [phone] 03/16/2014, 9:38 AM

## 2014-03-16 NOTE — Progress Notes (Signed)
PATIENT ID: Abundio MiuEvan Faulstich   1 Day Post-Op Procedure(s) (LRB): SHOULDER HEMI-ARTHROPLASTY (Right)  Subjective: Reports difficulty sleeping overnight due to pain. Reports improvement in pain this am and tolerable now. No other complaints or concerns.   Objective:  Filed Vitals:   03/16/14 0541  BP: 114/65  Pulse: 64  Temp: 98.9 F (37.2 C)  Resp: 18     Awake, alert, orientated R UE dressing saturated 50% with blood, changed today Incision benign Wiggles fingers, distally NVI  Labs:   Recent Labs  03/16/14 0650  HGB 12.6*   Recent Labs  03/16/14 0650  WBC 8.9  RBC 3.98*  HCT 36.4*  PLT 154  No results found for this basename: NA, K, CL, CO2, BUN, CREATININE, GLUCOSE, CALCIUM,  in the last 72 hours  Assessment and Plan: 1 day s/p right shoulder hemiarthroplasty Dressing changed today, everything looks good Will work with PT on passive ROM within limitations D/c home today when cleared by PT Already has percocet prescription filled at home and will take that for home pain control  VTE proph: ASA 325mg  BID, SCDs

## 2014-03-19 ENCOUNTER — Encounter (HOSPITAL_COMMUNITY): Payer: Self-pay | Admitting: Orthopedic Surgery

## 2014-09-27 IMAGING — DX DG SHOULDER 2+V PORT*R*
1 series · 1 of 1 positions shown · non-contrast
Comparison: None.

CLINICAL DATA: Post arthroplasty

EXAM:
PORTABLE RIGHT SHOULDER - 1+ VIEW

[ap]
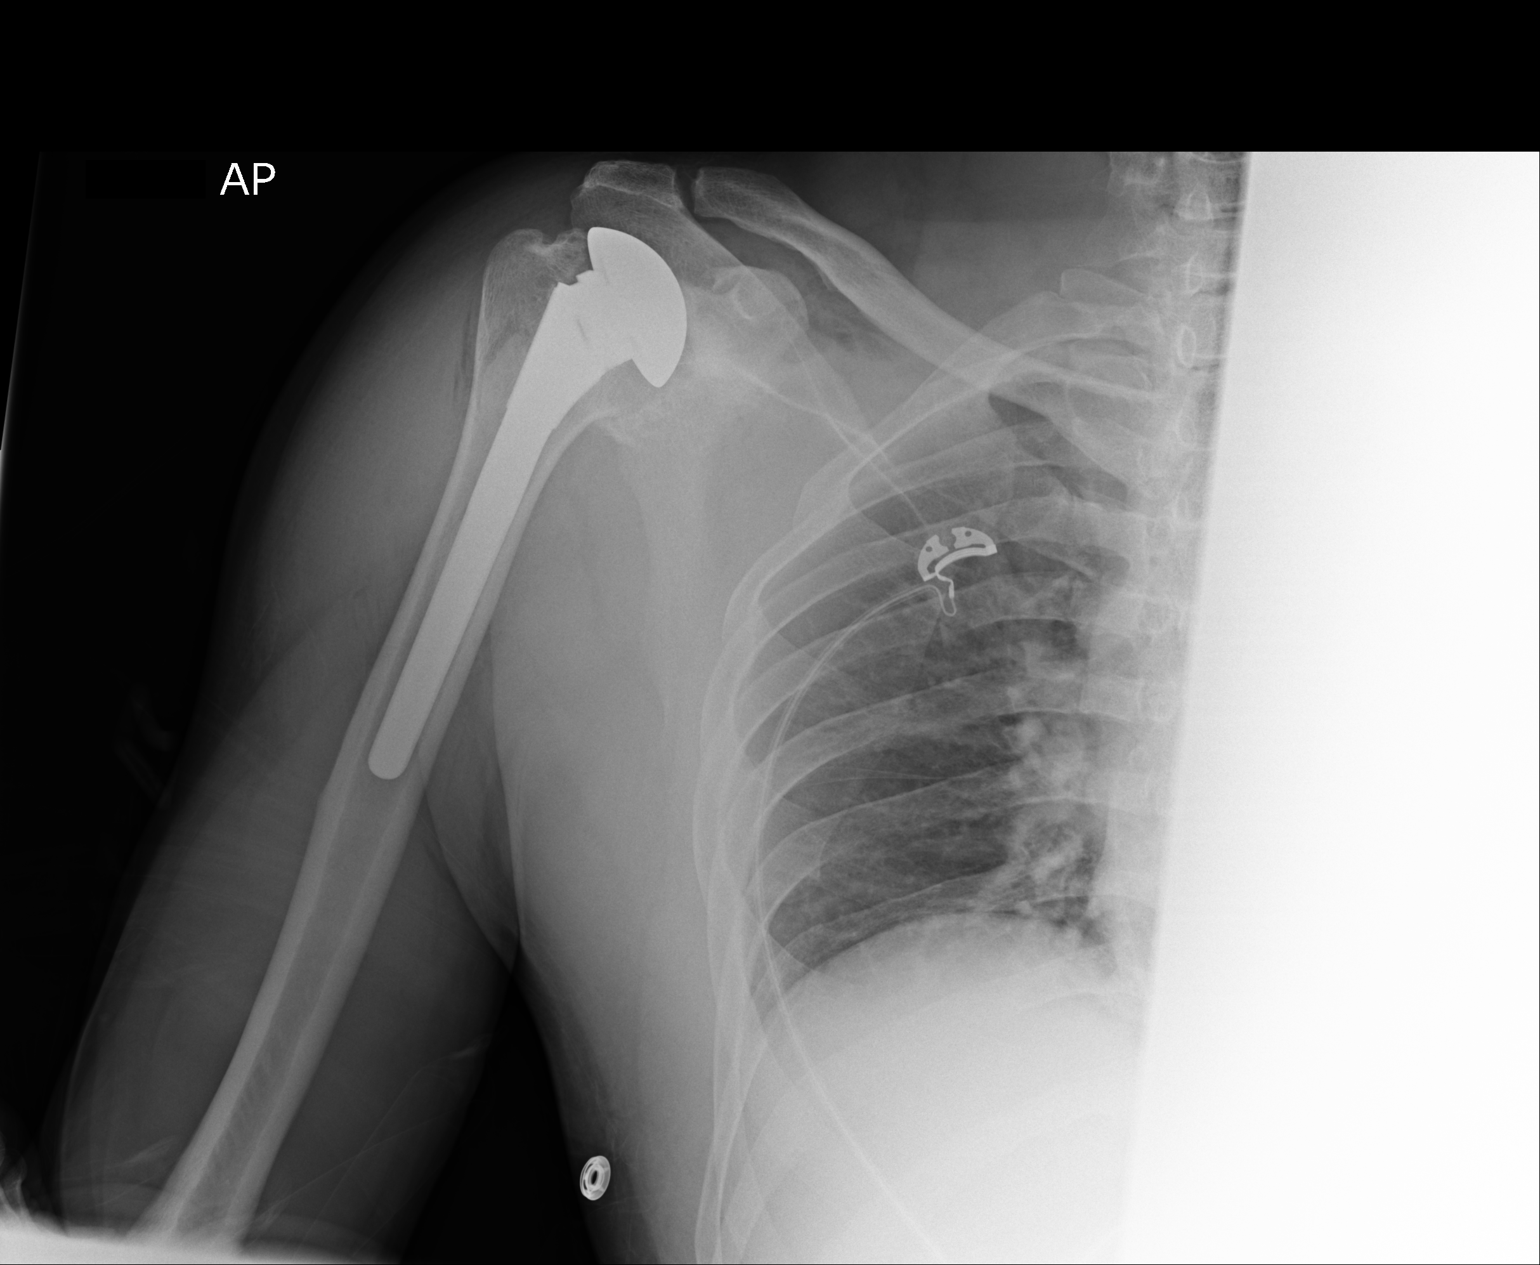

[1 of 1 positions shown; findings below may reference images not displayed]

FINDINGS: Single AP view of the right shoulder submitted. There is right
shoulder prosthesis in anatomic alignment.
IMPRESSION: Right shoulder prosthesis anatomic alignment.

## 2015-05-07 DIAGNOSIS — M25519 Pain in unspecified shoulder: Secondary | ICD-10-CM | POA: Insufficient documentation

## 2018-06-20 ENCOUNTER — Encounter: Payer: Self-pay | Admitting: Gastroenterology

## 2018-08-17 ENCOUNTER — Ambulatory Visit: Payer: Non-veteran care | Admitting: Gastroenterology

## 2019-06-09 ENCOUNTER — Emergency Department (HOSPITAL_COMMUNITY): Payer: No Typology Code available for payment source

## 2019-06-09 ENCOUNTER — Emergency Department (HOSPITAL_COMMUNITY)
Admission: EM | Admit: 2019-06-09 | Discharge: 2019-06-09 | Disposition: A | Discharge status: Home / Self Care | Payer: No Typology Code available for payment source | Attending: Emergency Medicine | Admitting: Emergency Medicine

## 2019-06-09 ENCOUNTER — Other Ambulatory Visit: Payer: Self-pay

## 2019-06-09 ENCOUNTER — Encounter (HOSPITAL_COMMUNITY): Payer: Self-pay

## 2019-06-09 DIAGNOSIS — F17210 Nicotine dependence, cigarettes, uncomplicated: Secondary | ICD-10-CM | POA: Insufficient documentation

## 2019-06-09 DIAGNOSIS — I10 Essential (primary) hypertension: Secondary | ICD-10-CM | POA: Insufficient documentation

## 2019-06-09 DIAGNOSIS — Z79899 Other long term (current) drug therapy: Secondary | ICD-10-CM | POA: Diagnosis not present

## 2019-06-09 DIAGNOSIS — R2241 Localized swelling, mass and lump, right lower limb: Secondary | ICD-10-CM | POA: Insufficient documentation

## 2019-06-09 DIAGNOSIS — J45909 Unspecified asthma, uncomplicated: Secondary | ICD-10-CM | POA: Diagnosis not present

## 2019-06-09 MED ORDER — INDOMETHACIN 50 MG PO CAPS: 50.0000 mg | ORAL_CAPSULE | Freq: Two times a day (BID) | ORAL | 0 refills | Status: AC

## 2019-06-09 MED ORDER — PREDNISONE 20 MG PO TABS: ORAL_TABLET | ORAL | 0 refills | Status: DC

## 2019-06-09 MED ORDER — COLCHICINE 0.6 MG PO TABS: 0.6000 mg | ORAL_TABLET | Freq: Every day | ORAL | 0 refills | Status: DC

## 2019-06-09 NOTE — Discharge Instructions (Signed)
Your foot pain is likely due to gout.  Take medications prescribed.  If you develop fever, progressive worsening pain, or rash please return for evaluation.

## 2019-06-09 NOTE — ED Notes (Signed)
ED Provider at bedside. 

## 2019-06-09 NOTE — ED Notes (Signed)
XR at bedside

## 2019-06-09 NOTE — ED Provider Notes (Addendum)
[facility]-EMERGENCY DEPT Provider Note   CSN: [id number] Arrival date & time: 06/09/19  1253     History   Chief Complaint Chief Complaint  Patient presents with   Foot Swelling    HPI Frederick Roberts is a 48 y.o. male.     The history is provided by the patient. No language interpreter was used.     48 year old male presenting for evaluation of right foot pain.  Patient developed gradual onset of sharp throbbing pain to his right foot ongoing for the past 2 to 3 days.  Pain is directly over bunion surgery test first metatarsal region.  Surgery was done in 2003.  He noticed increasing swelling.  He has been using ice without adequate relief.  Rates his pain as 8 out of 10.  He denies any prior history of gout, denies fever or chills.  No complaints of ankle pain or knee pain.  Denies any change in his diet.  Pain is nonradiating.  Past Medical History:  Diagnosis Date   Anxiety    Arthritis    "joints" (03/15/2014)   Asthma    as a child   Depression    hx of suicidal thoughts and one time attempt   GERD (gastroesophageal reflux disease)    takes Prilosec   Headache(784.0)    "2-3 times/wk" (03/15/2014)   Hypertension    Migraines    "@ least once/wk" (03/15/2014)   OSA on CPAP    "sometimes" uses CPAP   PTSD (post-traumatic stress disorder)    Sickle cell trait [facility](HCC)     Patient Active Problem List   Diagnosis Date Noted   Arthritis of shoulder region 03/15/2014    Past Surgical History:  Procedure Laterality Date   APPENDECTOMY  1993   BUNIONECTOMY Right 2003?   COLONOSCOPY     ESOPHAGOGASTRODUODENOSCOPY ENDOSCOPY     NASAL SEPTUM SURGERY  ~ 1992   SHOULDER ARTHROSCOPY Right 2013   "got tight after last OR; had to loosen it up & debride it"   SHOULDER HEMI-ARTHROPLASTY Right 2010   "put pins in it to stop it from dislocating"   SHOULDER HEMI-ARTHROPLASTY Right 03/15/2014   SHOULDER HEMI-ARTHROPLASTY Right  03/15/2014   Procedure: SHOULDER HEMI-ARTHROPLASTY;  Surgeon: Mable ParisJustin William Chandler, MD;  [facility];  Service: Orthopedics;  Laterality: Right;  Rigth shoulder hemiarthroplasty   VASECTOMY     WISDOM TOOTH EXTRACTION  1991   WRIST FRACTURE SURGERY Right 1993        Home Medications    Prior to Admission medications   Medication Sig Start Date End Date Taking? Authorizing Provider  amLODipine (NORVASC) 10 MG tablet Take 10 mg by mouth daily.    [provider]  atenolol (TENORMIN) 25 MG tablet Take 25 mg by mouth daily.    [provider]  buPROPion (ZYBAN) 150 MG 12 hr tablet Take 150 mg by mouth 2 (two) times daily.    [provider]  hydrALAZINE (APRESOLINE) 25 MG tablet Take 50 mg by mouth daily.    [provider]  hydrochlorothiazide (HYDRODIURIL) 25 MG tablet Take 25 mg by mouth daily.    [provider]  methocarbamol (ROBAXIN) 500 MG tablet Take 500 mg by mouth daily as needed for muscle spasms.    [provider]  omeprazole (PRILOSEC) 20 MG capsule Take 20 mg by mouth daily.    [provider]  QUEtiapine (SEROQUEL) 100 MG tablet Take 100 mg by mouth at bedtime.  [provider]  SUMAtriptan (IMITREX) 50 MG tablet Take 50 mg by mouth every 2 (two) hours as needed for migraine or headache. May repeat in 2 hours if headache persists or recurs.    [provider]  traMADol (ULTRAM) 50 MG tablet Take 50 mg by mouth 3 (three) times daily as needed (for pain).    [provider]    Family History No family history on file.  Social History Social History   Tobacco Use   Smoking status: Current Every Day Smoker    Packs/day: 1.00    Years: 24.00    Pack years: 24.00    Types: Cigarettes   Smokeless tobacco: Never Used  Substance Use Topics   Alcohol use: Yes    Alcohol/week: 12.0 standard drinks    Types: 12 Cans of beer per week   Drug use: No     Allergies    Patient has no known allergies.   Review of Systems Review of Systems  Constitutional: Negative for fever.  Musculoskeletal: Positive for joint swelling.  Neurological: Negative for numbness.     Physical Exam Updated Vital Signs BP (!) 147/100 (BP Location: Right Arm)    Pulse 72    Temp 98.8 F (37.1 C) (Oral)    Resp 18    SpO2 100%   Physical Exam Vitals signs and nursing note reviewed.  Constitutional:      General: He is not in acute distress.    Appearance: He is well-developed.  HENT:     Head: Atraumatic.  Eyes:     Conjunctiva/sclera: Conjunctivae normal.  Neck:     Musculoskeletal: Neck supple.  Musculoskeletal:        General: Tenderness (Right foot: In the first metatarsal region it is edematous and exquisitely tender to palpation with warmth but no significant erythema.  Well-appearing surgical scar noted.  No deformity noted.  Brisk cap refill to all toes.  Right ankle nontender.  ) present.  Skin:    Findings: No rash.  Neurological:     Mental Status: He is alert.      ED Treatments / Results  Labs (all labs ordered are listed, but only abnormal results are displayed) Labs Reviewed - No data to display  EKG None  Radiology Dg Foot Complete Right  Result Date: 06/09/2019 CLINICAL DATA:  Dorsal and medial pain, swelling, starting 2-3 days ago. No injury. History of bunion surgery. EXAM: RIGHT FOOT COMPLETE - 3+ VIEW COMPARISON:  None. FINDINGS: Osseous alignment is normal. No fracture line or displaced fracture fragment seen. No acute or suspicious osseous finding. No degenerative change. Soft tissues about the RIGHT foot are unremarkable. Fixation screw and pins at the base of the first metatarsal bone, presumably related to given history of bunion surgery or prior injury. This hardware appears appropriately positioned. IMPRESSION: No acute findings. Electronically Signed   By: Bary RichardStan  Maynard M.D.   On: 06/09/2019 14:36    Procedures Procedures  (including critical care time)  Medications Ordered in ED Medications - No data to display   Initial Impression / Assessment and Plan / ED Course  I have reviewed the triage vital signs and the nursing notes.  Pertinent labs & imaging results that were available during my care of the patient were reviewed by me and considered in my medical decision making (see chart for details).        BP (!) 147/100 (BP Location: Right Arm)    Pulse 72  Temp 98.8 F (37.1 C) (Oral)    Resp 18    SpO2 100%    Final Clinical Impressions(s) / ED Diagnoses   Final diagnoses:  Localized swelling of right foot    ED Discharge Orders         Ordered    predniSONE (DELTASONE) 20 MG tablet     06/09/19 1444    indomethacin (INDOCIN) 50 MG capsule  2 times daily with meals     06/09/19 1444    colchicine 0.6 MG tablet  Daily     06/09/19 1444         1:32 PM Patient here with pain and swelling to his right foot at the first metatarsal region.  Symptoms resemble gout or possible cellulitis.  He has prior bunion surgery at that same specific site.  Will obtain x-ray to rule out bony pathology.  Anticipate discharging home with prednisone's, indomethacin, and colchicine.  Doubt septic joint.  2:42 PM Xray of R foot is without acute finding.  Will treat for suspect gout.  However pt made aware to return if sxs worsen, and to be evaluate for potential infection, although my suspicion is low for infection.    Domenic Moras, PA-C 06/09/19 1445    Domenic Moras, PA-C 06/09/19 1503    Lacretia Leigh, MD 06/14/19 [phone]

## 2019-06-09 NOTE — ED Triage Notes (Signed)
Patient c/o foot swelling.   Patient noticed swelling to right foot 3 days ago that is now getting worse.   Patient states he had bunion surgery in 2003 where the swelling is located.    Patient using crutches in triage.  9/10 pain.   A/ox4

## 2019-12-22 IMAGING — CR RIGHT FOOT COMPLETE - 3+ VIEW
3 series · 3 of 3 positions shown · non-contrast
Comparison: None.

CLINICAL DATA: Dorsal and medial pain, swelling, starting 2-3 days
ago. No injury. History of bunion surgery.

EXAM:
RIGHT FOOT COMPLETE - 3+ VIEW

[t foot ap right]
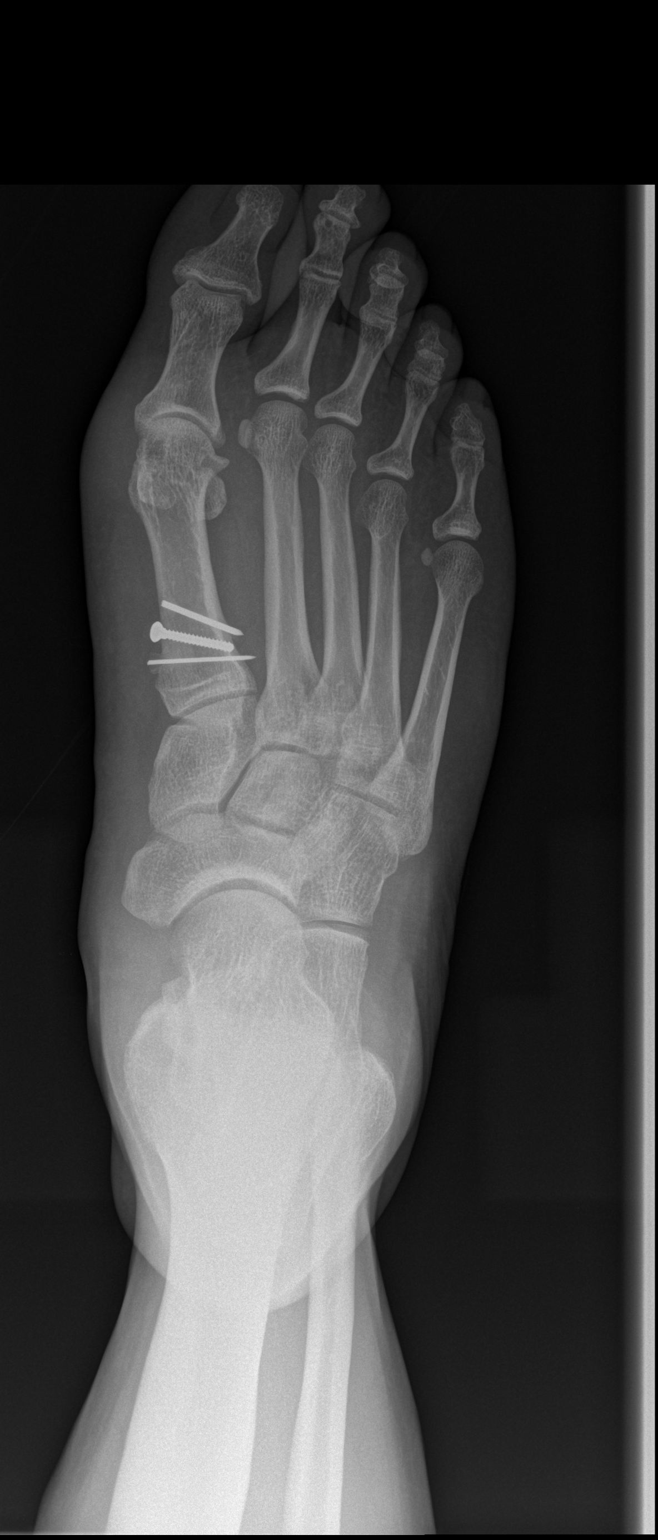

[t foot obl right]
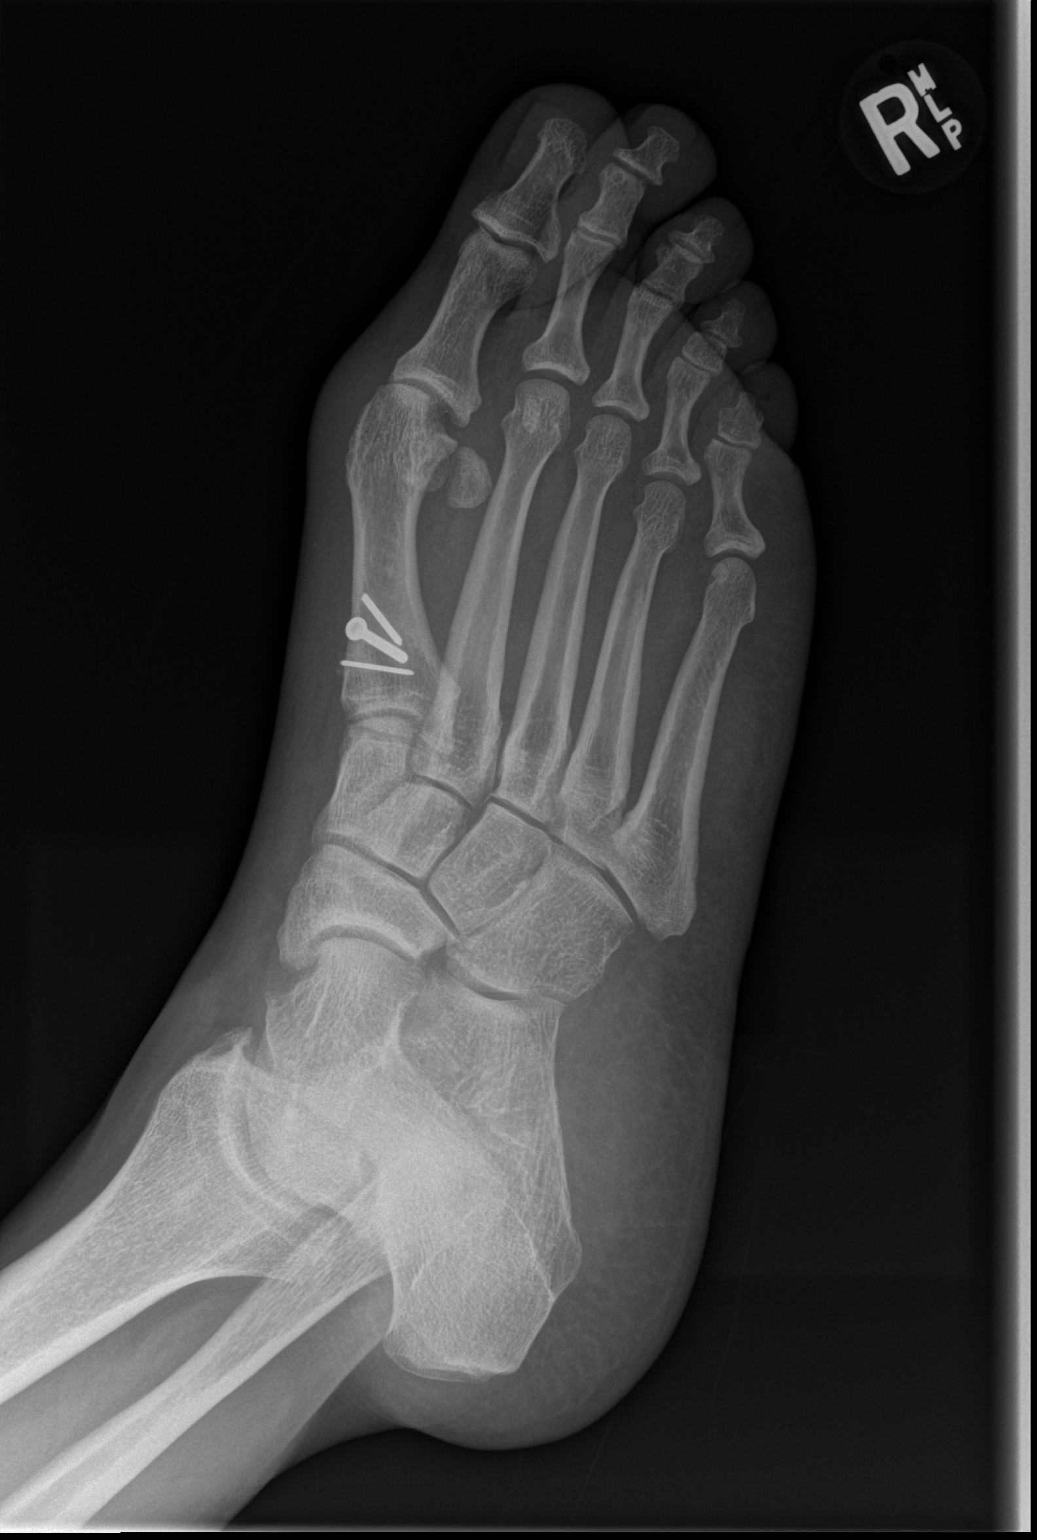

[t foot lat right]
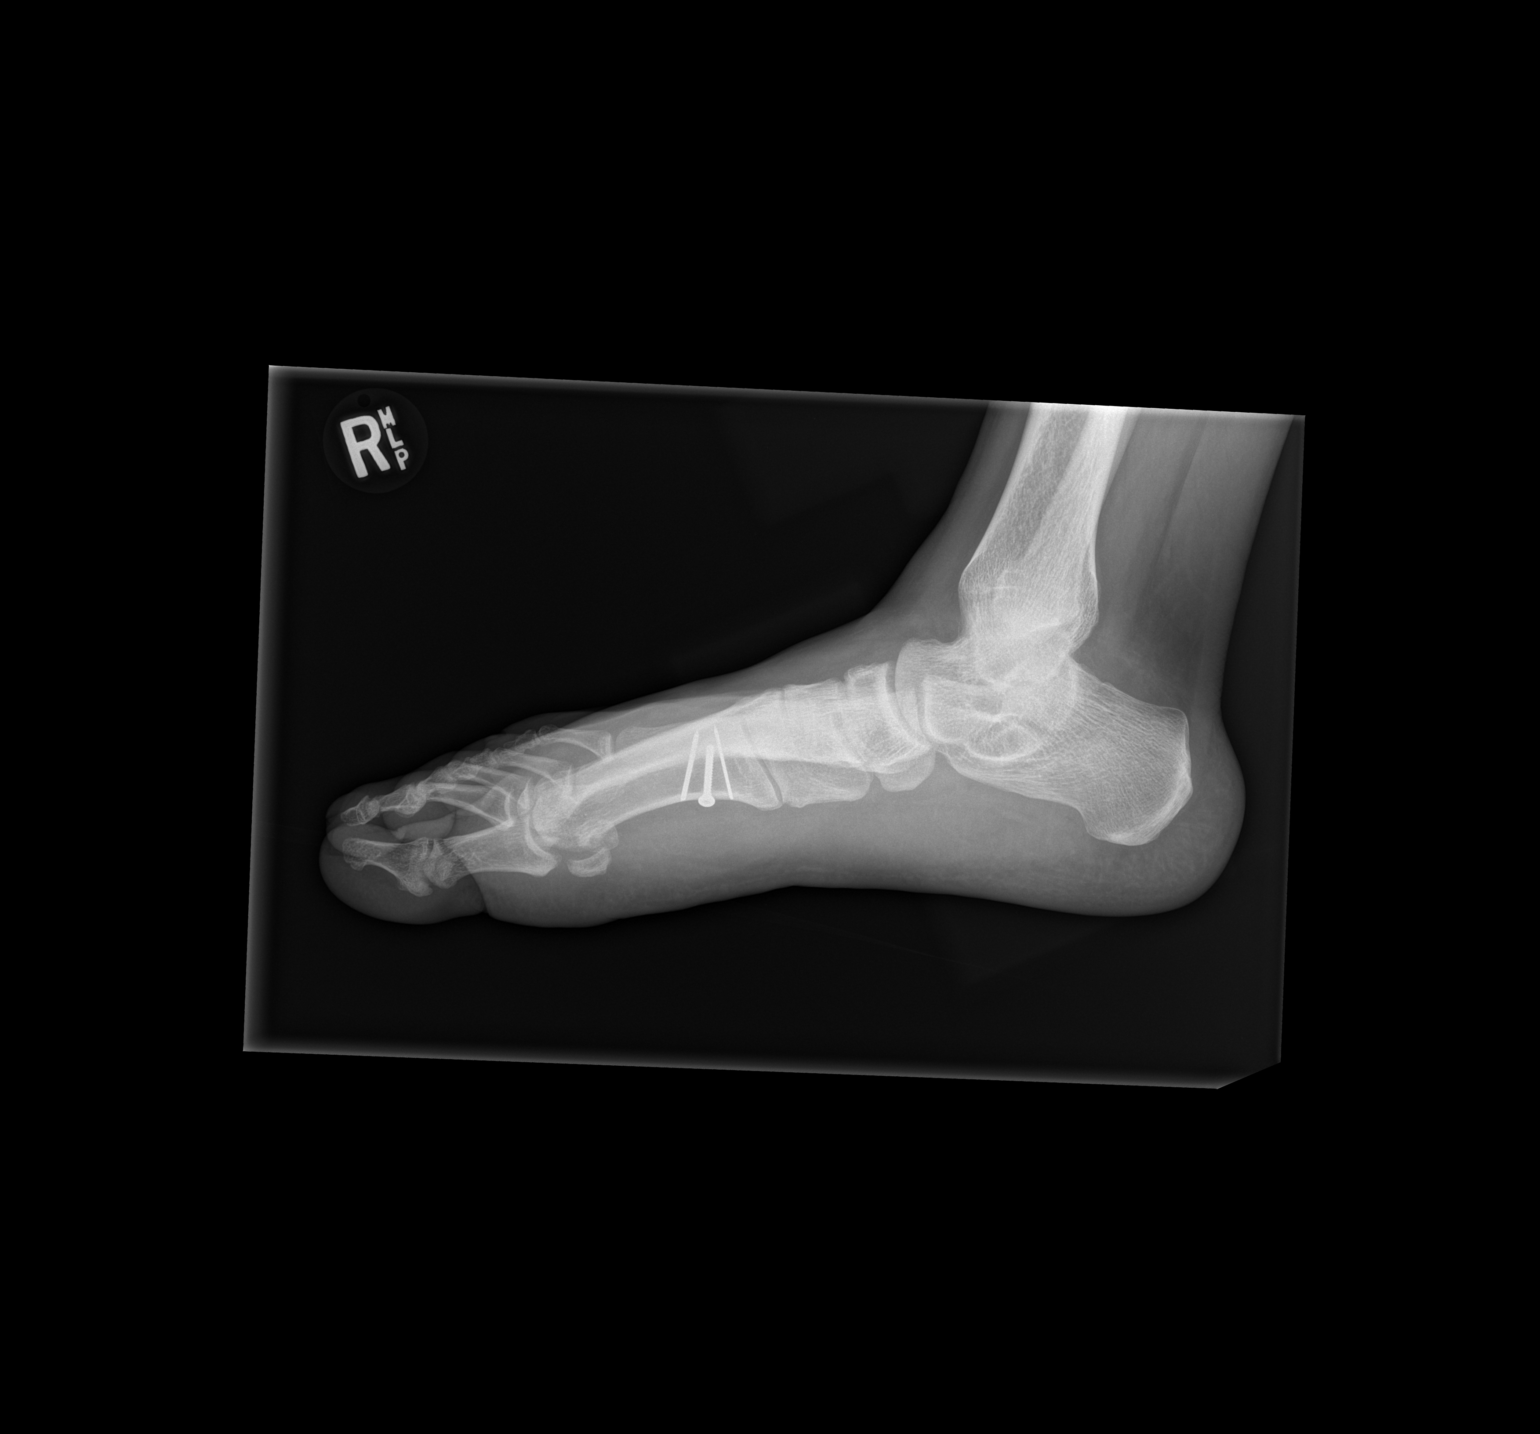

[3 of 3 positions shown; findings below may reference images not displayed]

FINDINGS: Osseous alignment is normal. No fracture line or displaced fracture
fragment seen. No acute or suspicious osseous finding. No
degenerative change. Soft tissues about the RIGHT foot are
unremarkable.

Fixation screw and pins at the base of the first metatarsal bone,
presumably related to given history of bunion surgery or prior
injury. This hardware appears appropriately positioned.
IMPRESSION: No acute findings.

## 2020-02-22 ENCOUNTER — Ambulatory Visit: Payer: No Typology Code available for payment source | Attending: Internal Medicine

## 2020-02-22 DIAGNOSIS — Z23 Encounter for immunization: Secondary | ICD-10-CM

## 2020-02-22 NOTE — Progress Notes (Signed)
   Covid-19 Vaccination Clinic  Name:  Frederick Roberts    MRN: [id number] DOB: 1971-03-24  02/22/2020  Mr. Frederick Roberts was observed post Covid-19 immunization for 15 minutes without incident. He was provided with Vaccine Information Sheet and instruction to access the V-Safe system.   Mr. Frederick Roberts was instructed to call 911 with any severe reactions post vaccine: Marland Kitchen Difficulty breathing  . Swelling of face and throat  . A fast heartbeat  . A bad rash all over body  . Dizziness and weakness   Immunizations Administered    Name Date Dose VIS Date Route   Pfizer COVID-19 Vaccine 02/22/2020  9:48 AM 0.3 mL 11/10/2019 Intramuscular   Manufacturer: [employer], [employer]   Lot: [id number]   NDC: [address]-5

## 2020-03-18 ENCOUNTER — Ambulatory Visit: Payer: No Typology Code available for payment source | Attending: Internal Medicine

## 2020-03-18 DIAGNOSIS — Z23 Encounter for immunization: Secondary | ICD-10-CM

## 2020-03-18 NOTE — Progress Notes (Signed)
   Covid-19 Vaccination Clinic  Name:  Frederick Roberts    MRN: [id number] DOB: August 03, 1971  03/18/2020  Frederick Roberts was observed post Covid-19 immunization for 15 minutes without incident. He was provided with Vaccine Information Sheet and instruction to access the V-Safe system.   Frederick Roberts was instructed to call 911 with any severe reactions post vaccine: Marland Kitchen Difficulty breathing  . Swelling of face and throat  . A fast heartbeat  . A bad rash all over body  . Dizziness and weakness   Immunizations Administered    Name Date Dose VIS Date Route   Pfizer COVID-19 Vaccine 03/18/2020  9:04 AM 0.3 mL 01/24/2019 Intramuscular   Manufacturer: [employer], [employer]   Lot: [id number]   NDC: [address]-8

## 2022-05-18 ENCOUNTER — Ambulatory Visit: Payer: No Typology Code available for payment source | Admitting: Internal Medicine

## 2022-09-21 ENCOUNTER — Encounter: Payer: Self-pay | Admitting: Internal Medicine

## 2022-09-21 ENCOUNTER — Ambulatory Visit (INDEPENDENT_AMBULATORY_CARE_PROVIDER_SITE_OTHER): Payer: No Typology Code available for payment source | Admitting: Internal Medicine

## 2022-09-21 VITALS — BP 136/82 | HR 72 | Temp 98.2°F | Ht 68.0 in | Wt 172.0 lb

## 2022-09-21 DIAGNOSIS — F17210 Nicotine dependence, cigarettes, uncomplicated: Secondary | ICD-10-CM

## 2022-09-21 DIAGNOSIS — Z23 Encounter for immunization: Secondary | ICD-10-CM | POA: Diagnosis not present

## 2022-09-21 DIAGNOSIS — R058 Other specified cough: Secondary | ICD-10-CM | POA: Diagnosis not present

## 2022-09-21 DIAGNOSIS — J449 Chronic obstructive pulmonary disease, unspecified: Secondary | ICD-10-CM

## 2022-09-21 MED ORDER — OMEPRAZOLE 40 MG PO CPDR: 40.0000 mg | DELAYED_RELEASE_CAPSULE | Freq: Every day | ORAL | 11 refills | Status: AC

## 2022-09-21 MED ORDER — FAMOTIDINE 20 MG PO TABS: ORAL_TABLET | ORAL | 11 refills | Status: AC

## 2022-09-21 NOTE — Assessment & Plan Note (Signed)
Active smoker - [facility] suggest severe Sept 7 2023  - 09/21/2022   Walked on RA  x  3  lap(s) =  approx 450  ft  @ mod to fast  pace, stopped due to end of study  with lowest 02 sats 95% and no sob  - 09/21/2022  After extensive coaching inhaler device,  effectiveness =   90% with hfa and smi > try stiolto 2 each am and Airsupra sample to replace his proair for prn use    I suspect he is predominently Pt is Group B in terms of symptom/risk and laba/lama therefore appropriate rx at this point >>>  stiolto best choice and least likely to make him ocugh x 2 weeks sample then regroup.

## 2022-09-21 NOTE — Assessment & Plan Note (Addendum)
Onset around 2013 - 09/21/2022  max rx for gerd / d/c all dpi's   Upper airway cough syndrome (previously labeled PNDS),  is so named because it's frequently impossible to sort out how much is  CR/sinusitis with freq throat clearing (which can be related to primary GERD)   vs  causing  secondary (" extra esophageal")  GERD from wide swings in gastric pressure that occur with throat clearing, often  promoting self use of mint and menthol lozenges that reduce the lower esophageal sphincter tone and exacerbate the problem further in a cyclical fashion.   These are the same pts (now being labeled as having "irritable larynx syndrome" by some cough centers) who not infrequently have a history of having failed to tolerate ace inhibitors,  dry powder inhalers esp advair  or biphosphonates or report having atypical/extraesophageal reflux symptoms that don't respond to standard doses of PPI  and are easily confused as having aecopd or asthma flares by even experienced allergists/ pulmonologists (myself included).   Each maintenance medication was reviewed in detail including emphasizing most importantly the difference between maintenance and prns and under what circumstances the prns are to be triggered using an action plan format where appropriate.  Total time for H and P, chart review, counseling, reviewing hfa/smi device(s) , directly observing portions of ambulatory 02 saturation study/ and generating customized AVS unique to this office visit / same day charting> 60 min with pt new to me and multiple complaints

## 2022-09-21 NOTE — Assessment & Plan Note (Addendum)
Counseled re importance of smoking cessation but did not meet time criteria for separate billing   °

## 2022-09-21 NOTE — Progress Notes (Signed)
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 Albuterol  to it referred to pulmonary clinic in [place]  [facility]  since ? 03/2022  "fine"   History of Present Illness  09/21/2022  Pulmonary/ 1st office eval/ Jany Buckwalter / [place] [facility]  Chief Complaint  Patient presents with   Consult    [facility]  W[facility]    Dyspnea:  steps at home gradually more difficult/ can  go anywhere  slow pace  Cough: x 10 years min mucoid  Sleep: electric 30 degrees can't do cpap / wife says cough pt not aware  / says choking  SABA use: proair 7 h prior to OV  and neb > 12 h prior to OV    No obvious day to day or daytime pattern/variability or assoc excess/ purulent sputum or mucus plugs or hemoptysis or cp or chest tightness, subjective wheeze or overt sinus or hb symptoms.     Also denies any obvious fluctuation of symptoms with weather or environmental changes or other aggravating or alleviating factors except as outlined above   No unusual exposure hx or h/o childhood pna/ asthma or knowledge of premature birth.  Current Allergies, Complete Past Medical History, Past Surgical History, Family History, and Social History were reviewed in [employer] record.  ROS  The following are not active complaints unless bolded Hoarseness, sore throat, dysphagia, dental problems, itching, sneezing,  nasal congestion or discharge of excess mucus or purulent secretions, ear ache,   fever, chills, sweats, unintended wt loss or wt gain, classically pleuritic or exertional cp,  orthopnea pnd or arm/hand swelling  or leg swelling,  presyncope, palpitations, abdominal pain, anorexia, nausea, vomiting, diarrhea  or change in bowel habits or change in bladder habits, change in stools or change in urine, dysuria, hematuria,  rash, arthralgias, visual complaints, headache, numbness, weakness or ataxia or problems with walking or coordination,  change in mood or  memory.           Past Medical History:  Diagnosis Date   Anxiety    Arthritis    "joints" (03/15/2014)   Asthma    as a child   Depression    hx of suicidal thoughts and one time attempt   GERD (gastroesophageal reflux disease)    takes Prilosec   Headache(784.0)    "2-3 times/wk" (03/15/2014)   Hypertension    Migraines    "@ least once/wk" (03/15/2014)   OSA on CPAP    "sometimes" uses CPAP   PTSD (post-traumatic stress disorder)    Sickle cell trait (HCC)     Outpatient Medications Prior to Visit  Medication Sig Dispense Refill   amLODipine (NORVASC) 10 MG tablet Take 10 mg by mouth daily.     atenolol (TENORMIN) 25 MG tablet Take 25 mg by mouth daily.     hydrALAZINE (APRESOLINE) 25 MG tablet Take 50 mg by mouth daily.     hydrochlorothiazide (HYDRODIURIL) 25 MG tablet Take 25 mg by mouth daily.     indomethacin (INDOCIN) 50 MG capsule  Take 1 capsule (50 mg total) by mouth 2 (two) times daily with a meal. 15 capsule 0   omeprazole (PRILOSEC) 20 MG capsule Take 20 mg by mouth daily.     QUEtiapine (SEROQUEL) 100 MG tablet Take 100 mg by mouth at bedtime.     SUMAtriptan (IMITREX) 50 MG tablet Take 50 mg by mouth every 2 (two) hours as needed for migraine or headache. May repeat in 2 hours if headache persists or recurs.     traMADol (ULTRAM) 50 MG tablet Take 50 mg by mouth 3 (three) times daily as needed (for pain).     buPROPion (ZYBAN) 150 MG 12 hr tablet Take 150 mg by mouth 2 (two) times daily.     colchicine 0.6 MG tablet Take 1 tablet (0.6 mg total) by mouth daily. 10 tablet 0   methocarbamol (ROBAXIN) 500 MG tablet Take 500 mg by mouth daily  as needed for muscle spasms.     predniSONE (DELTASONE) 20 MG tablet 3 tabs po day one, then 2 tabs daily x 4 days 11 tablet 0   No facility-administered medications prior to visit.     Objective:     BP 136/82 (BP Location: Left Arm, Patient Position: Sitting)   Pulse 72   Temp 98.2 F (36.8 C) (Temporal)   Ht 5\' 8"  (1.727 m)   Wt 172 lb (78 kg)   SpO2 100% Comment: ra  BMI 26.15 kg/m   SpO2: 100 % (ra)  Somber amb bm nad   HEENT : Oropharynx  clear   Nasal turbinates nl    NECK :  without  apparent JVD/ palpable Nodes/TM    LUNGS: no acc muscle use,  Min barrel  contour chest wall with bilateral  slightly decreased bs s audible wheeze and  without cough on insp or exp maneuvers and min  Hyperresonant  to  percussion bilaterally    CV:  RRR  no s3 or murmur or increase in P2, and no edema   ABD:  soft and nontender with pos end  insp Hoover's  in the supine position.  No bruits or organomegaly appreciated   MS:  Nl gait/ ext warm without deformities Or obvious joint restrictions  calf tenderness, cyanosis or clubbing     SKIN: warm and dry without lesions    NEURO:  alert, approp, nl sensorium with  no motor or cerebellar deficits apparent.      I personally reviewed images and agree with radiology impression as follows:   Chest CT10/26/2022  Stable emphysematous changes and  MPNs no change sicne 2016           Assessment   COPD GOLD ? [facility] 20[facility]  Active smoker - [place] pfts suggest severe Sept 7 2023  - 09/21/2022   Walked on RA  x  3  lap(s) =  approx 450  ft  @ mod to fast  pace, stopped due to end of study  with lowest 02 sats 95% and no sob  - 09/21/2022  After extensive coaching inhaler device,  effectiveness =   90% with hfa and smi > try stiolto 2 each am and Airsupra sample to replace his proair for prn use    I suspect he is predominently Pt is Group B in terms of symptom/risk and laba/lama therefore appropriate rx at this  point >>>  stiolto best choice and least likely to make him ocugh x 2 weeks sample then regroup.  Cigarette smoker Counseled re importance of smoking cessation but did not meet time criteria for separate billing               Upper airway cough syndrome Onset around 2013 - 09/21/2022  max rx for gerd / d/c all dpi's   Upper airway cough syndrome (previously labeled PNDS),  is so named because it's frequently impossible to sort out how much is  CR/sinusitis with freq throat clearing (which can be related to primary GERD)   vs  causing  secondary (" extra esophageal")  GERD from wide swings in gastric pressure that occur with throat clearing, often  promoting self use of mint and menthol lozenges that reduce the lower esophageal sphincter tone and exacerbate the problem further in a cyclical fashion.   These are the same pts (now being labeled as having "irritable larynx syndrome" by some cough centers) who not infrequently have a history of having failed to tolerate ace inhibitors,  dry powder inhalers esp advair  or biphosphonates or report having atypical/extraesophageal reflux symptoms that don't respond to standard doses of PPI  and are easily confused as having aecopd or asthma flares by even experienced allergists/ pulmonologists (myself included).   Each maintenance medication was reviewed in detail including emphasizing most importantly the difference between maintenance and prns and under what circumstances the prns are to be triggered using an action plan format where appropriate.  Total time for H and P, chart review, counseling, reviewing hfa/smi device(s) , directly observing portions of ambulatory 02 saturation study/ and generating customized AVS unique to this office visit / same day charting> 60 min with pt new to me and multiple complaints          Christinia Gully, MD 09/21/2022

## 2022-09-21 NOTE — Patient Instructions (Signed)
Omaprazole 20 mg x 2  (or one 40 mg)   and Pepcid ac (famotidine) 20 mg one after supper until cough is completely gone for at least a week without the need for cough suppression  GERD (REFLUX)  is an extremely common cause of respiratory symptoms just like yours , many times with no obvious heartburn at all.    It can be treated with medication, but also with lifestyle changes including elevation of the head of your bed (ideally with 6 -8inch blocks under the headboard of your bed),  Smoking cessation, avoidance of late meals, excessive alcohol, and avoid fatty foods, chocolate, peppermint, colas, red wine, and acidic juices such as orange juice.  NO MINT OR MENTHOL PRODUCTS SO NO COUGH DROPS  USE SUGARLESS CANDY INSTEAD (Jolley ranchers or Stover's or Life Savers) or even ice chips will also do - the key is to swallow to prevent all throat clearing. NO OIL BASED VITAMINS - use powdered substitutes.  Avoid fish oil when coughing.   Plan A = Automatic = Always=    Stiolto 2 puffs each am   Work on inhaler technique:  relax and gently blow all the way out then take a nice smooth full deep breath back in, triggering the inhaler at same time you start breathing in.  Hold breath in for at least  5 seconds if you can.  . Rinse and gargle with water when done.  If mouth or throat bother you at all,  try brushing teeth/gums/tongue with arm and hammer toothpaste/ make a slurry and gargle and spit out.       Plan B = Backup (to supplement plan A, not to replace it) Only use your albuterol inhaler as a rescue medication to be used if you can't catch your breath by resting or doing a relaxed purse lip breathing pattern.  - The less you use it, the better it will work when you need it. - Ok to use the inhaler up to 1-  2 puffs  every 4 hours if you must but call for appointment if use goes up over your usual need - Don't leave home without it !!  (think of it like the starter fluid)    Plan C = Crisis  (instead of Plan B but only if Plan B stops working) - only use your albuterol nebulizer if you first try Plan B and it fails to help > ok to use the nebulizer up to every 4 hours but if start needing it regularly call for immediate appointment   Also  Ok to try albuterol 15 min before an activity (on alternating days)  that you know would usually make you short of breath and see if it makes any difference and if makes none then don't take albuterol after activity unless you can't catch your breath as this means it's the resting that helps, not the albuterol.  The key is to stop smoking completely before smoking completely stops you!   Please schedule a follow up office visit in 2  weeks, sooner if needed  with all medications /inhalers/ solutions in hand so we can verify exactly what you are taking. This includes all medications from all doctors and over the counters.

## 2022-10-09 ENCOUNTER — Ambulatory Visit (INDEPENDENT_AMBULATORY_CARE_PROVIDER_SITE_OTHER): Payer: No Typology Code available for payment source | Admitting: Internal Medicine

## 2022-10-09 ENCOUNTER — Encounter: Payer: Self-pay | Admitting: Internal Medicine

## 2022-10-09 VITALS — BP 124/72 | HR 66 | Temp 97.7°F | Ht 68.0 in | Wt 175.4 lb

## 2022-10-09 DIAGNOSIS — F17210 Nicotine dependence, cigarettes, uncomplicated: Secondary | ICD-10-CM | POA: Diagnosis not present

## 2022-10-09 DIAGNOSIS — R058 Other specified cough: Secondary | ICD-10-CM

## 2022-10-09 DIAGNOSIS — J449 Chronic obstructive pulmonary disease, unspecified: Secondary | ICD-10-CM | POA: Diagnosis not present

## 2022-10-09 MED ORDER — STIOLTO RESPIMAT 2.5-2.5 MCG/ACT IN AERS: 2.0000 | INHALATION_SPRAY | Freq: Every day | RESPIRATORY_TRACT | 0 refills | Status: DC

## 2022-10-09 NOTE — Progress Notes (Unsigned)
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 Albuterol  to it referred to pulmonary clinic in [place]  [facility]   [facility]  since ? 03/2022  "fine"   History of Present Illness  09/21/2022  Pulmonary/ 1st office eval/ Talena Neira / [place] [facility]  Chief Complaint  Patient presents with   Consult    [facility]  W[facility] flu shot today    Dyspnea:  steps at home gradually more difficult/ can  go anywhere  slow pace  Cough: x 10 years min mucoid  Sleep: electric 30 degrees can't do cpap / wife says cough pt not aware  / says choking  SABA use: proair 7 h prior to OV  and neb > 12 h prior to OV   Rec Omaprazole 20 mg x 2  (or one 40 mg)   and Pepcid ac (famotidine) 20 mg one after supper until cough is completely gone for at least a week without the need for cough suppression GERD  diet Plan A = Automatic = Always=    Stiolto 2 puffs each am  Work on inhaler technique: Plan B = Backup (to supplement plan A, not to replace it) Only use your albuterol inhaler as a rescue medication  Plan C = Crisis (instead of Plan B but only if Plan B stops working) - only use your albuterol nebulizer if you first try Plan B  Also  Ok to try albuterol 15 min before an activity (on alternating days)  that you know would usually make you short of breath  The key is to stop smoking completely before smoking completely stops you! Please schedule a follow up office visit in 2  weeks, sooner if needed  with all medications /inhalers/ solutions in hand       10/09/2022  f/u ov/[place] office/Marcus Groll re: COPD  maint on no consitent inhalers/ max gerd rx helping cough  Chief  Complaint  Patient presents with   Follow-up    Same since last ov   Dyspnea:  steps are only problem Cough: def better off dpi /min mucoid production/ still smoking  Sleeping: better / no more choking/ 30 degrees = baseline  SABA use: rarely  02: none  Covid status: x 3  [facility]    No obvious day to day or daytime variability or assoc excess/ purulent sputum or mucus plugs or hemoptysis or cp or chest tightness, subjective wheeze or overt sinus or hb symptoms.   Sleeping as above  without nocturnal  or early am exacerbation  of respiratory  c/o's or need for noct saba. Also denies any obvious fluctuation of symptoms with weather or environmental changes or other aggravating or alleviating factors except as outlined above   No unusual exposure hx or h/o childhood pna/ asthma or knowledge of premature birth.  Current Allergies, Complete Past Medical History, Past Surgical History, Family History, and Social History were reviewed in [employer] record.  ROS  The following are not active complaints unless bolded Hoarseness, sore throat, dysphagia, dental problems, itching, sneezing,  nasal congestion or discharge of excess mucus or purulent secretions, ear ache,   fever, chills, sweats, unintended wt loss or wt gain, classically pleuritic or exertional cp,  orthopnea pnd or arm/hand swelling  or leg swelling, presyncope, palpitations, abdominal pain, anorexia, nausea, vomiting, diarrhea  or change in bowel habits or change in bladder habits, change in stools or change in urine, dysuria, hematuria,  rash, arthralgias, visual complaints, headache, numbness, weakness or ataxia or problems with walking or coordination,  change in mood or  memory.        Current Meds  Medication Sig   amLODipine (NORVASC) 10 MG tablet Take 10 mg by mouth daily.   atenolol (TENORMIN) 25 MG tablet Take 25 mg by mouth daily.   famotidine (PEPCID) 20 MG tablet One after  supper   hydrALAZINE (APRESOLINE) 25 MG tablet Take 50 mg by mouth daily.   hydrochlorothiazide (HYDRODIURIL) 25 MG tablet Take 25 mg by mouth daily.   indomethacin (INDOCIN) 50 MG capsule Take 1 capsule (50 mg total) by mouth 2 (two) times daily with a meal.   Protonix 40 mg  Take 1 capsule (40 mg total) by mouth daily.   QUEtiapine (SEROQUEL) 100 MG tablet Take 100 mg by mouth at bedtime.   SUMAtriptan (IMITREX) 50 MG tablet Take 50 mg by mouth every 2 (two) hours as needed for migraine or headache. May repeat in 2 hours if headache persists or recurs.   traMADol (ULTRAM) 50 MG tablet Take 50 mg by mouth 3 (three) times daily as needed (for pain).           Past Medical History:  Diagnosis Date   Anxiety    Arthritis    "joints" (03/15/2014)   Asthma    as a child   Depression    hx of suicidal thoughts and one time attempt   GERD (gastroesophageal reflux disease)    takes Prilosec   Headache(784.0)    "2-3 times/wk" (03/15/2014)   Hypertension    Migraines    "@ least once/wk" (03/15/2014)   OSA on CPAP    "sometimes" uses CPAP   PTSD (post-traumatic stress disorder)    Sickle cell trait (HCC)        Objective:      Wt Readings from Last 3 Encounters:  10/09/22 175 lb 6.4 oz (79.6 kg)  09/21/22 172 lb (78 kg)  03/15/14 180 lb (81.6 kg)    Vital signs reviewed  10/09/2022  - Note at rest 02 sats  98% on RA   General appearance:    amb somber bm nad easily confused with names /purposes of meds (brought them all and still struggling)       HEENT : Oropharynx  clear   Nasal turbinates nl    NECK :  without  apparent JVD/ palpable Nodes/TM    LUNGS: no acc muscle use,  Min barrel  contour chest wall with bilateral  slightly decreased bs s audible wheeze and  without cough on insp or exp maneuvers and min  Hyperresonant  to  percussion bilaterally    CV:  RRR  no s3 or murmur or increase in P2, and no edema   ABD:  soft and nontender with pos end  insp Hoover's  in  the supine position.  No bruits or organomegaly appreciated   MS:  Nl gait/ ext warm without deformities Or obvious joint restrictions  calf tenderness, cyanosis or clubbing    SKIN: warm and dry without lesions  NEURO:  alert, approp, nl sensorium with  no motor or cerebellar deficits apparent.             Assessment

## 2022-10-09 NOTE — Patient Instructions (Addendum)
Plan A = Automatic = Always=    Stiolto 2 puffs each am   Plan B = Backup (to supplement plan A, not to replace it) Only use your albuterol inhaler as a rescue medication to be used if you can't catch your breath by resting or doing a relaxed purse lip breathing pattern.  - The less you use it, the better it will work when you need it. - Ok to use the inhaler up to 2 puffs  every 4 hours if you must but call for appointment if use goes up over your usual need - Don't leave home without it !!  (think of it like the spare tire for your car)   Plan C = Crisis (instead of Plan B but only if Plan B stops working) - only use your albuterol nebulizer if you first try Plan B and it fails to help > ok to use the nebulizer up to every 4 hours but if start needing it regularly call for immediate appointment   Please schedule a follow up office visit in 4 weeks, sooner if needed with inhalers in [place] office

## 2022-10-10 ENCOUNTER — Encounter: Payer: Self-pay | Admitting: Internal Medicine

## 2022-10-10 NOTE — Assessment & Plan Note (Signed)
Counseled re importance of smoking cessation but did not meet time criteria for separate billing    Low-dose CT lung cancer screening is recommended for patients who are 35-50 years of age with a 20+ pack-year history of smoking and who are currently smoking or quit <=15 years ago. No coughing up blood  No unintentional weight loss of > 15 pounds in the last 6 months - pt is eligible for scanning yearly until >>> eligible now and x 15 y p quits but gets care thru [place] so encouraged him to discuss this issue at next ov there         Each maintenance medication was reviewed in detail including emphasizing most importantly the difference between maintenance and prns and under what circumstances the prns are to be triggered using an action plan format where appropriate.  Total time for H and P, chart review, counseling, reviewing hfa/neb device(s) and generating customized AVS unique to this office visit / same day charting = 31 min

## 2022-10-10 NOTE — Assessment & Plan Note (Signed)
[facility] 7 2023  - 09/21/2022   Walked on RA  x  3  lap(s) =  approx 450  ft  @ mod to fast  pace, stopped due to end of study  with lowest 02 sats 95% and no sob  - 09/21/2022    try stiolto 2 each am and Airsupra sample to replace his proair for prn use  > much better - 10/09/2022  After extensive coaching inhaler device,  effectiveness =    90% > continue stiolto 2 each am x 4 weeks then return to [place] office for same day spirometry   Pt is Group B in terms of symptom/risk and laba/lama therefore appropriate rx at this point >>>  try stiolto 2 each am or [facility] equ[facility]ent pending return for pfts.  Advised:  formulary restrictions will be an ongoing challenge for the forseable future and I would be happy to pick an alternative if the pt will first  provide me a list of them -  pt  will need to return here for training for any new device that is required eg dpi vs hfa vs respimat.    In the meantime we can always provide samples so that the patient never runs out of any needed respiratory medications.

## 2022-10-10 NOTE — Assessment & Plan Note (Signed)
Onset around 2013 - 09/21/2022  max rx for gerd / d/c all dpi's > much improved at ov 10/09/2022   Of the three most common causes of  Sub-acute / recurrent or chronic cough, only one (GERD)  can actually contribute to/ trigger  the other two (asthma and post nasal drip syndrome)  and perpetuate the cylce of cough.  While not intuitively obvious, many patients with chronic low grade reflux do not cough until there is a primary insult that disturbs the protective epithelial barrier and exposes sensitive nerve endings.   This is typically viral but can due to PNDS and  either may apply here.     >>>The point is that once this occurs, it is difficult to eliminate the cycle  using anything but a maximally effective acid suppression regimen at least in the short run, accompanied by an appropriate diet to address non acid GERD  which I've asked him to continue x 3 month minimal to set baseline then consider transition to just pepcid in long run if tol and consider GI eval if can't wean off ppi successfully   Discussed in detail all the  indications, usual  risks and alternatives  relative to the benefits with patient who agrees to proceed with Rx as outlined.

## 2022-11-10 ENCOUNTER — Ambulatory Visit (INDEPENDENT_AMBULATORY_CARE_PROVIDER_SITE_OTHER): Payer: No Typology Code available for payment source | Admitting: Internal Medicine

## 2022-11-10 ENCOUNTER — Ambulatory Visit (INDEPENDENT_AMBULATORY_CARE_PROVIDER_SITE_OTHER): Payer: No Typology Code available for payment source

## 2022-11-10 ENCOUNTER — Encounter: Payer: Self-pay | Admitting: Internal Medicine

## 2022-11-10 VITALS — BP 120/74 | HR 69 | Temp 98.9°F | Ht 68.0 in | Wt 177.0 lb

## 2022-11-10 DIAGNOSIS — J449 Chronic obstructive pulmonary disease, unspecified: Secondary | ICD-10-CM

## 2022-11-10 DIAGNOSIS — R058 Other specified cough: Secondary | ICD-10-CM | POA: Diagnosis not present

## 2022-11-10 DIAGNOSIS — F17210 Nicotine dependence, cigarettes, uncomplicated: Secondary | ICD-10-CM | POA: Diagnosis not present

## 2022-11-10 MED ORDER — ALBUTEROL SULFATE HFA 108 (90 BASE) MCG/ACT IN AERS: 2.0000 | INHALATION_SPRAY | RESPIRATORY_TRACT | 2 refills | Status: DC | PRN

## 2022-11-10 MED ORDER — SPIRIVA RESPIMAT 2.5 MCG/ACT IN AERS: 2.0000 | INHALATION_SPRAY | Freq: Every day | RESPIRATORY_TRACT | 11 refills | Status: DC

## 2022-11-10 MED ORDER — STIOLTO RESPIMAT 2.5-2.5 MCG/ACT IN AERS: 2.0000 | INHALATION_SPRAY | Freq: Every day | RESPIRATORY_TRACT | 0 refills | Status: DC
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
## 2022-11-10 NOTE — Patient Instructions (Addendum)
Need a copy of last PFT and  a low dose CT chest report (not the scan itself)   Plan A = Automatic = Always=    Spiriva 2 puffs each am   Plan B = Backup (to supplement plan A, not to replace it) Only use your albuterol inhaler as a rescue medication to be used if you can't catch your breath by resting or doing a relaxed purse lip breathing pattern.  - The less you use it, the better it will work when you need it. - Ok to use the inhaler up to 2 puffs  every 4 hours if you must but call for appointment if use goes up over your usual need - Don't leave home without it !!  (think of it like the spare tire for your car)    Please schedule a follow up visit in 6 months but call sooner if needed here in [place] for PFTs

## 2022-11-10 NOTE — Assessment & Plan Note (Signed)
[facility] Sept 7 2023  - 09/21/2022   Walked on RA  x  3  lap(s) =  approx 450  ft  @ mod to fast  pace, stopped due to end of study  with lowest 02 sats 95% and no sob  - 09/21/2022    try stiolto 2 each am and Airsupra sample to replace his proair for prn use  > much better - 10/09/2022  After extensive coaching inhaler device,  effectiveness =    90% > continue stiolto 2 each am x 4 weeks  > improved but stiolto not on formulary  - 11/10/2022  After extensive coaching inhaler device,  effectiveness =    95% on smi > changed to spriiva 2.5   I believe he has relatively mild copd/ group B symptoms so best we can do on [place] formulary is spiriva 2.5 for now and f/u in 6 m with pfts and review his LDSCT report at that point

## 2022-11-10 NOTE — Assessment & Plan Note (Signed)
Counseled re importance of smoking cessation but did not meet time criteria for separate billing           Each maintenance medication was reviewed in detail including emphasizing most importantly the difference between maintenance and prns and under what circumstances the prns are to be triggered using an action plan format where appropriate.  Total time for H and P, chart review, counseling, reviewing hfa/smi  device(s) and generating customized AVS unique to this office visit / same day charting  > 30 min  

## 2022-11-10 NOTE — Assessment & Plan Note (Signed)
Onset around 2013 - 09/21/2022  max rx for gerd / d/c all dpi's > much improved at ov 10/09/2022   Mostly resolved at this point > continue present rx

## 2022-12-28 ENCOUNTER — Ambulatory Visit (INDEPENDENT_AMBULATORY_CARE_PROVIDER_SITE_OTHER): Payer: Medicare HMO | Admitting: Family Medicine

## 2022-12-28 ENCOUNTER — Other Ambulatory Visit: Payer: Self-pay

## 2022-12-28 ENCOUNTER — Other Ambulatory Visit: Payer: Self-pay | Admitting: Family Medicine

## 2022-12-28 ENCOUNTER — Other Ambulatory Visit (HOSPITAL_COMMUNITY): Payer: Self-pay

## 2022-12-28 ENCOUNTER — Encounter: Payer: Self-pay | Admitting: Family Medicine

## 2022-12-28 VITALS — BP 130/98 | HR 57 | Temp 97.8°F | Ht 68.0 in | Wt 181.0 lb

## 2022-12-28 DIAGNOSIS — R69 Illness, unspecified: Secondary | ICD-10-CM | POA: Diagnosis not present

## 2022-12-28 DIAGNOSIS — K219 Gastro-esophageal reflux disease without esophagitis: Secondary | ICD-10-CM

## 2022-12-28 DIAGNOSIS — Z1211 Encounter for screening for malignant neoplasm of colon: Secondary | ICD-10-CM | POA: Diagnosis not present

## 2022-12-28 DIAGNOSIS — Z113 Encounter for screening for infections with a predominantly sexual mode of transmission: Secondary | ICD-10-CM

## 2022-12-28 DIAGNOSIS — Z0001 Encounter for general adult medical examination with abnormal findings: Secondary | ICD-10-CM

## 2022-12-28 DIAGNOSIS — Z125 Encounter for screening for malignant neoplasm of prostate: Secondary | ICD-10-CM | POA: Diagnosis not present

## 2022-12-28 DIAGNOSIS — Z23 Encounter for immunization: Secondary | ICD-10-CM

## 2022-12-28 DIAGNOSIS — R7303 Prediabetes: Secondary | ICD-10-CM

## 2022-12-28 DIAGNOSIS — Z114 Encounter for screening for human immunodeficiency virus [HIV]: Secondary | ICD-10-CM

## 2022-12-28 DIAGNOSIS — Z1159 Encounter for screening for other viral diseases: Secondary | ICD-10-CM

## 2022-12-28 DIAGNOSIS — Z87898 Personal history of other specified conditions: Secondary | ICD-10-CM | POA: Diagnosis not present

## 2022-12-28 DIAGNOSIS — F17210 Nicotine dependence, cigarettes, uncomplicated: Secondary | ICD-10-CM

## 2022-12-28 DIAGNOSIS — Z122 Encounter for screening for malignant neoplasm of respiratory organs: Secondary | ICD-10-CM

## 2022-12-28 DIAGNOSIS — E782 Mixed hyperlipidemia: Secondary | ICD-10-CM

## 2022-12-28 DIAGNOSIS — F329 Major depressive disorder, single episode, unspecified: Secondary | ICD-10-CM

## 2022-12-28 DIAGNOSIS — I10 Essential (primary) hypertension: Secondary | ICD-10-CM

## 2022-12-28 DIAGNOSIS — Z0000 Encounter for general adult medical examination without abnormal findings: Secondary | ICD-10-CM

## 2022-12-28 MED ORDER — ATENOLOL 25 MG PO TABS
25.0000 mg | ORAL_TABLET | Freq: Every day | ORAL | 3 refills | Status: AC
  Filled 2022-12-28: qty 30, 30d supply, fill #0
  Filled 2023-05-07: qty 30, 30d supply, fill #1

## 2022-12-28 MED ORDER — AMLODIPINE BESYLATE 10 MG PO TABS
10.0000 mg | ORAL_TABLET | Freq: Every day | ORAL | 3 refills | Status: AC
  Filled 2022-12-28: qty 30, 30d supply, fill #0
  Filled 2023-05-07: qty 30, 30d supply, fill #1

## 2022-12-28 NOTE — Patient Instructions (Signed)
It was great to meet you today and I'm excited to have you join the [facility] practice. I hope you had a positive experience today! If you feel so inclined, please feel free to recommend our practice to friends and family. Jahmier Willadsen, FNP-C  

## 2022-12-28 NOTE — Assessment & Plan Note (Signed)

## 2022-12-28 NOTE — Assessment & Plan Note (Signed)
Chronic. Elevated in office at 130/98 today. He reports this is high for him and his BP is <130/80 at home. Will monitor this week and reports values sustained >130/80. Continue Amlodipine 10mg  daily and Atenolol 25mg  daily. He was unsure exactly which medications he is currently taking so will verify and update via MyChart. Instructed to seek medical care for chest pain, palpitations, shortness of breath, lightheadedness.

## 2022-12-28 NOTE — Progress Notes (Signed)
New Patient Office Visit  Subjective    Patient ID: Frederick Roberts, male    DOB: Dec 22, 1970  Age: 52 y.o. MRN: [id number]  CC:  Chief Complaint  Patient presents with   Establish Care    HPI Clearence Vitug presents to establish care. Oriented to practice routines and expectations. [name] has a PMH significant for asthma, GERD, migraines, GOUT, chronic right shoulder pain, cataracts, prediabetes, and hypertension, all well controlled. He sees Pulmonology for his asthma.   Colon Ca: age 14 colonoscopy normal, due PSA: due Tobacco: current every day [id number], CT 09/24/2021 showed unchanged from 2016 scattered pulmonary nodules including 53mm nodule in right apex and right major fissure, will repeat Vaccines: UTD STI: ordered    Outpatient Encounter Medications as of 12/28/2022  Medication Sig   albuterol (VENTOLIN HFA) 108 (90 Base) MCG/ACT inhaler Inhale 2 puffs into the lungs every 4 (four) hours as needed for wheezing or shortness of breath.   famotidine (PEPCID) 20 MG tablet One after supper   indomethacin (INDOCIN) 50 MG capsule Take 1 capsule (50 mg total) by mouth 2 (two) times daily with a meal.   omeprazole (PRILOSEC) 40 MG capsule Take 1 capsule (40 mg total) by mouth daily.   QUEtiapine (SEROQUEL) 100 MG tablet Take 100 mg by mouth at bedtime.   SUMAtriptan (IMITREX) 50 MG tablet Take 50 mg by mouth every 2 (two) hours as needed for migraine or headache. May repeat in 2 hours if headache persists or recurs.   Tiotropium Bromide Monohydrate (SPIRIVA RESPIMAT) 2.5 MCG/ACT AERS Inhale 2 puffs into the lungs daily.   Tiotropium Bromide-Olodaterol (STIOLTO RESPIMAT) 2.5-2.5 MCG/ACT AERS Inhale 2 puffs into the lungs daily.   traMADol (ULTRAM) 50 MG tablet Take 50 mg by mouth 3 (three) times daily as needed (for pain).   [DISCONTINUED] amLODipine (NORVASC) 10 MG tablet Take 10 mg by mouth daily.   [DISCONTINUED] atenolol (TENORMIN) 25 MG tablet Take 25 mg by mouth daily.   No  facility-administered encounter medications on file as of 12/28/2022.    Past Medical History:  Diagnosis Date   Anxiety    Arthritis    "joints" (03/15/2014)   Asthma    as a child   Depression    hx of suicidal thoughts and one time attempt   GERD (gastroesophageal reflux disease)    takes Prilosec   Headache(784.0)    "2-3 times/wk" (03/15/2014)   Hypertension    Migraines    "@ least once/wk" (03/15/2014)   OSA on CPAP    "sometimes" uses CPAP   PTSD (post-traumatic stress disorder)    Sickle cell trait ([place])     Past Surgical History:  Procedure Laterality Date   APPENDECTOMY  1993   BUNIONECTOMY Right 2003?   COLONOSCOPY     ESOPHAGOGASTRODUODENOSCOPY ENDOSCOPY     NASAL SEPTUM SURGERY  ~ 1992   SHOULDER ARTHROSCOPY Right 2013   "got tight after last OR; had to loosen it up & debride it"   SHOULDER HEMI-ARTHROPLASTY Right 2010   "put pins in it to stop it from dislocating"   SHOULDER HEMI-ARTHROPLASTY Right 03/15/2014   SHOULDER HEMI-ARTHROPLASTY Right 03/15/2014   Procedure: SHOULDER HEMI-ARTHROPLASTY;  Surgeon: Nita Sells, MD;  Location: [facility];  Service: Orthopedics;  Laterality: Right;  Rigth shoulder hemiarthroplasty   VASECTOMY     WISDOM TOOTH EXTRACTION  1991   WRIST FRACTURE SURGERY Right 1993    History reviewed. No pertinent family history.  Social History  Socioeconomic History   Marital status: Legally Separated    Spouse name: Not on file   Number of children: Not on file   Years of education: Not on file   Highest education level: Not on file  Occupational History   Not on file  Tobacco Use   Smoking status: Every Day    Packs/day: 1.00    Years: 24.00    Total pack years: 24.00    Types: Cigarettes   Smokeless tobacco: Never   Tobacco comments:    Pt states he smokes 1 pack a day as of 11/10/2022. LW  Substance and Sexual Activity   Alcohol use: Yes    Alcohol/week: 12.0 standard drinks of alcohol    Types: 12 Cans of  beer per week   Drug use: No   Sexual activity: Not Currently  Other Topics Concern   Not on file  Social History Narrative   Not on file   Social Determinants of Health   Financial Resource Strain: Not on file  Food Insecurity: Not on file  Transportation Needs: Not on file  Physical Activity: Not on file  Stress: Not on file  Social Connections: Not on file  Intimate Partner Violence: Not on file    Review of Systems  Constitutional: Negative.   HENT: Negative.    Eyes: Negative.   Respiratory:  Positive for wheezing.   Cardiovascular: Negative.   Gastrointestinal:  Positive for abdominal pain (epigastric).  Genitourinary: Negative.   Musculoskeletal:  Positive for joint pain (right shoulder).  Skin: Negative.   Neurological: Negative.   Endo/Heme/Allergies: Negative.   Psychiatric/Behavioral:  Positive for depression. Negative for suicidal ideas. The patient is nervous/anxious.   All other systems reviewed and are negative.        12/28/2022   10:57 AM  GAD 7 : Generalized Anxiety Score  Nervous, Anxious, on Edge 3  Control/stop worrying 3  Worry too much - different things 3  Trouble relaxing 3  Restless 2  Easily annoyed or irritable 3  Afraid - awful might happen 2  Total GAD 7 Score 19  Anxiety Difficulty Very difficult        12/28/2022   10:57 AM  Depression screen PHQ 2/9  Decreased Interest 3  Down, Depressed, Hopeless 2  PHQ - 2 Score 5  Altered sleeping 3  Tired, decreased energy 2  Change in appetite 3  Feeling bad or failure about yourself  0  Trouble concentrating 1  Moving slowly or fidgety/restless 1  Suicidal thoughts 0  PHQ-9 Score 15  Difficult doing work/chores Extremely dIfficult     Objective    BP (!) 130/98   Pulse (!) 57   Temp 97.8 F (36.6 C) (Oral)   Ht 5\' 8"  (1.727 m)   Wt 181 lb (82.1 kg)   SpO2 98%   BMI 27.52 kg/m   Physical Exam Vitals and nursing note reviewed.  Constitutional:      Appearance:  Normal appearance. He is normal weight.  HENT:     Head: Normocephalic and atraumatic.     Right Ear: Tympanic membrane, ear canal and external ear normal.     Left Ear: Tympanic membrane, ear canal and external ear normal.     Nose: Nose normal.     Mouth/Throat:     Mouth: Mucous membranes are moist.     Pharynx: Oropharynx is clear.  Eyes:     Extraocular Movements: Extraocular movements intact.     Right eye:  Normal extraocular motion and no nystagmus.     Left eye: Normal extraocular motion and no nystagmus.     Conjunctiva/sclera: Conjunctivae normal.     Pupils: Pupils are equal, round, and reactive to light.  Cardiovascular:     Rate and Rhythm: Normal rate and regular rhythm.     Pulses: Normal pulses.     Heart sounds: Normal heart sounds.  Pulmonary:     Effort: Pulmonary effort is normal.     Breath sounds: Normal breath sounds.  Abdominal:     General: Bowel sounds are normal.     Palpations: Abdomen is soft.  Genitourinary:    Comments: Deferred using shared decision making Musculoskeletal:        General: Normal range of motion.     Cervical back: Normal range of motion and neck supple.  Skin:    General: Skin is warm and dry.     Capillary Refill: Capillary refill takes less than 2 seconds.  Neurological:     General: No focal deficit present.     Mental Status: He is alert. Mental status is at baseline.  Psychiatric:        Mood and Affect: Mood normal.        Speech: Speech normal.        Behavior: Behavior normal.        Thought Content: Thought content normal.        Cognition and Memory: Cognition and memory normal.        Judgment: Judgment normal.         Assessment & Plan:   Problem List Items Addressed This Visit       Cardiovascular and Mediastinum   Primary hypertension    Chronic. Elevated in office at 130/98 today. He reports this is high for him and his BP is <130/80 at home. Will monitor this week and reports values sustained  >130/80. Continue Amlodipine 10mg  daily and Atenolol 25mg  daily. He was unsure exactly which medications he is currently taking so will verify and update via MyChart. Instructed to seek medical care for chest pain, palpitations, shortness of breath, lightheadedness.      Relevant Orders   CBC with Differential/Platelet   COMPLETE METABOLIC PANEL WITH GFR   Lipid panel     Digestive   Gastroesophageal reflux disease without esophagitis     Other   Cigarette nicotine dependence without complication   Relevant Orders   CT CHEST [place]   Ambulatory Referral for Lung Cancer Scre   Physical exam, annual - Primary    Today your medical history was reviewed and routine physical exam with labs was performed. Recommend 150 minutes of moderate intensity exercise weekly and consuming a well-balanced diet. Advised to stop smoking if a smoker, avoid smoking if a non-smoker, limit alcohol consumption to 1 drink per day for women and 2 drinks per day for men, and avoid illicit drug use. Counseled on safe sex practices and offered STI testing today. Counseled on the importance of sunscreen use. Counseled in mental health awareness and when to seek medical care. Vaccine maintenance discussed. Appropriate health maintenance items reviewed. Return to office in 1 year for annual physical exam.       Relevant Orders   CBC with Differential/Platelet   COMPLETE METABOLIC PANEL WITH GFR   Lipid panel   Ambulatory referral to Psychiatry   Major depressive disorder with current active episode   Relevant Orders   Ambulatory  referral to Psychiatry   Other Visit Diagnoses     Colon cancer screening       Relevant Orders   Ambulatory referral to Gastroenterology   Prostate cancer screening       Relevant Orders   PSA   History of prediabetes       Relevant Orders   Hemoglobin A1c   Encounter for screening for lung cancer       Routine screening for STI (sexually  transmitted infection)       Relevant Orders   HepB+HepC+HIV Panel   RPR   HSV(herpes simplex vrs) 1+2 ab-IgG   C. trachomatis/N. gonorrhoeae RNA   Prediabetes           Return in about 3 months (around 03/29/2023) for f/u bp.   Park Meo, FNP

## 2022-12-29 ENCOUNTER — Encounter: Payer: Self-pay | Admitting: Gastroenterology

## 2022-12-29 ENCOUNTER — Other Ambulatory Visit: Payer: No Typology Code available for payment source

## 2022-12-29 NOTE — Addendum Note (Signed)
Addended by: Rubie Maid on: 12/29/2022 08:22 AM   Modules accepted: Orders

## 2022-12-30 LAB — LIPID PANEL
Cholesterol: 224 mg/dL — ABNORMAL HIGH (ref <200)
HDL: 53 mg/dL (ref >=40)
LDL Cholesterol (Calc): 129 mg/dL (calc) — ABNORMAL HIGH
Non-HDL Cholesterol (Calc): 171 mg/dL (calc) — ABNORMAL HIGH (ref <130)
Total CHOL/HDL Ratio: 4.2 (calc) (ref <5.0)
Triglycerides: 274 mg/dL — ABNORMAL HIGH (ref <150)

## 2022-12-30 LAB — CBC WITH DIFFERENTIAL/PLATELET
Absolute Monocytes: 410 cells/uL (ref 200–950)
Basophils Absolute: 11 cells/uL (ref 0–200)
Basophils Relative: 0.2 %
Eosinophils Absolute: 80 cells/uL (ref 15–500)
Eosinophils Relative: 1.4 %
HCT: 47.1 % (ref 38.5–50.0)
Hemoglobin: 16.1 g/dL (ref 13.2–17.1)
Lymphs Abs: 2269 cells/uL (ref 850–3900)
MCH: 31.9 pg (ref 27.0–33.0)
MCHC: 34.2 g/dL (ref 32.0–36.0)
MCV: 93.3 fL (ref 80.0–100.0)
MPV: 9.8 fL (ref 7.5–12.5)
Monocytes Relative: 7.2 %
Neutro Abs: 2930 cells/uL (ref 1500–7800)
Neutrophils Relative %: 51.4 %
Platelets: 224 10*3/uL (ref 140–400)
RBC: 5.05 10*6/uL (ref 4.20–5.80)
RDW: 13.2 % (ref 11.0–15.0)
Total Lymphocyte: 39.8 %
WBC: 5.7 10*3/uL (ref 3.8–10.8)

## 2022-12-30 LAB — HEMOGLOBIN A1C
Hgb A1c MFr Bld: 5.9 % of total Hgb — ABNORMAL HIGH (ref <5.7)
Mean Plasma Glucose: 123 mg/dL
eAG (mmol/L): 6.8 mmol/L

## 2022-12-30 LAB — COMPLETE METABOLIC PANEL WITH GFR
AG Ratio: 1.8 (calc) (ref 1.0–2.5)
ALT: 22 U/L (ref 9–46)
AST: 20 U/L (ref 10–35)
Albumin: 4.4 g/dL (ref 3.6–5.1)
Alkaline phosphatase (APISO): 81 U/L (ref 35–144)
BUN: 13 mg/dL (ref 7–25)
CO2: 28 mmol/L (ref 20–32)
Calcium: 9.2 mg/dL (ref 8.6–10.3)
Chloride: 105 mmol/L (ref 98–110)
Creat: 1.06 mg/dL (ref 0.70–1.30)
Globulin: 2.5 g/dL (calc) (ref 1.9–3.7)
Glucose, Bld: 91 mg/dL (ref 65–99)
Potassium: 3.6 mmol/L (ref 3.5–5.3)
Sodium: 144 mmol/L (ref 135–146)
Total Bilirubin: 0.5 mg/dL (ref 0.2–1.2)
Total Protein: 6.9 g/dL (ref 6.1–8.1)
eGFR: 85 mL/min/{1.73_m2} (ref >=60)

## 2022-12-30 LAB — HEPATITIS C ANTIBODY: Hepatitis C Ab: NONREACTIVE

## 2022-12-30 LAB — HSV(HERPES SIMPLEX VRS) I + II AB-IGG
HAV 1 IGG,TYPE SPECIFIC AB: <0.9 index
HSV 2 IGG,TYPE SPECIFIC AB: 4.69 index — ABNORMAL HIGH

## 2022-12-30 LAB — HIV ANTIBODY (ROUTINE TESTING W REFLEX): HIV 1&2 Ab, 4th Generation: NONREACTIVE

## 2022-12-30 LAB — PSA: PSA: 0.59 ng/mL (ref <=4.00)

## 2022-12-30 LAB — C. TRACHOMATIS/N. GONORRHOEAE RNA
C. trachomatis RNA, TMA: NOT DETECTED
N. gonorrhoeae RNA, TMA: NOT DETECTED

## 2022-12-30 LAB — RPR: RPR Ser Ql: NONREACTIVE

## 2022-12-30 MED ORDER — ATORVASTATIN CALCIUM 10 MG PO TABS: 10.0000 mg | ORAL_TABLET | Freq: Every day | ORAL | 3 refills | Status: AC

## 2022-12-30 NOTE — Addendum Note (Signed)
Addended by: Rubie Maid on: 12/30/2022 03:34 PM   Modules accepted: Orders

## 2023-01-19 ENCOUNTER — Encounter: Payer: Self-pay | Admitting: Family Medicine

## 2023-01-21 ENCOUNTER — Ambulatory Visit (AMBULATORY_SURGERY_CENTER): Payer: Medicare HMO

## 2023-01-21 VITALS — Ht 68.0 in | Wt 177.0 lb

## 2023-01-21 DIAGNOSIS — Z1211 Encounter for screening for malignant neoplasm of colon: Secondary | ICD-10-CM

## 2023-01-21 MED ORDER — PEG 3350-KCL-NA BICARB-NACL 420 G PO SOLR: 4000.0000 mL | Freq: Once | ORAL | 0 refills | Status: AC

## 2023-01-21 NOTE — Progress Notes (Signed)
No egg or soy allergy known to patient  No issues known to pt with past sedation with any surgeries or procedures Patient denies ever being told they had issues or difficulty with intubation  No FH of Malignant Hyperthermia Pt is not on diet pills Pt is not on  home 02  Pt is not on blood thinners  Pt denies issues with constipation  No A fib or A flutter Have any cardiac testing pending--no Pt instructed to use Singlecare.[email or web address] or GoodRx for a price reduction on prep  Patient's chart reviewed by Frederick Roberts CNRA prior to previsit and patient appropriate for the [facility].  Previsit completed and red dot placed by patient's name on their procedure day (on provider's schedule).    

## 2023-01-22 ENCOUNTER — Other Ambulatory Visit: Payer: Self-pay | Admitting: Family Medicine

## 2023-01-22 ENCOUNTER — Other Ambulatory Visit: Payer: No Typology Code available for payment source

## 2023-02-05 ENCOUNTER — Telehealth: Payer: Self-pay | Admitting: Gastroenterology

## 2023-02-05 NOTE — Telephone Encounter (Signed)
Okay, thanks for letting me know

## 2023-02-05 NOTE — Telephone Encounter (Addendum)
Good morning Dr. Havery Moros,   Patient called to cancel his procedure scheduled for 02/11/23 at 10:30AM. Patient states he has no one to stay with him during his procedure. He suggested if he could Melburn Popper, however he was advised he would still be required to have a care partner. Patient stated he would call back to reschedule once he found transportation.

## 2023-02-11 ENCOUNTER — Encounter: Payer: No Typology Code available for payment source | Admitting: Gastroenterology

## 2023-02-17 ENCOUNTER — Ambulatory Visit
Admission: RE | Admit: 2023-02-17 | Discharge: 2023-02-17 | Disposition: A | Discharge status: Home / Self Care | Payer: Non-veteran care | Source: Ambulatory Visit | Attending: Family Medicine | Admitting: Family Medicine

## 2023-02-17 DIAGNOSIS — F17210 Nicotine dependence, cigarettes, uncomplicated: Secondary | ICD-10-CM

## 2023-02-17 DIAGNOSIS — R69 Illness, unspecified: Secondary | ICD-10-CM | POA: Diagnosis not present

## 2023-02-18 ENCOUNTER — Telehealth: Payer: Self-pay

## 2023-02-18 NOTE — Telephone Encounter (Signed)
Joliza w[facility] called, stated that per Dr. Nehemiah Settle, he recommends that pt goes to [place].

## 2023-02-18 NOTE — Telephone Encounter (Signed)
Called [facility]. Spoke w/rep and per rep they do not take pt's insurance.

## 2023-02-24 ENCOUNTER — Telehealth: Payer: Self-pay | Admitting: Family Medicine

## 2023-02-24 NOTE — Telephone Encounter (Signed)
Contacted Frederick Roberts to schedule their annual wellness visit. Appointment made for 03/04/2023 .  Thank you,  Colletta Maryland,  [facility] Direct Dial ??[phone]

## 2023-03-04 ENCOUNTER — Ambulatory Visit (INDEPENDENT_AMBULATORY_CARE_PROVIDER_SITE_OTHER): Payer: Medicare HMO

## 2023-03-04 ENCOUNTER — Telehealth: Payer: Self-pay

## 2023-03-04 ENCOUNTER — Other Ambulatory Visit: Payer: Self-pay

## 2023-03-04 VITALS — Ht 68.0 in | Wt 177.0 lb

## 2023-03-04 DIAGNOSIS — Z0000 Encounter for general adult medical examination without abnormal findings: Secondary | ICD-10-CM

## 2023-03-04 NOTE — Patient Instructions (Signed)
Frederick Roberts , Thank you for taking time to come for your Medicare Wellness Visit. I appreciate your ongoing commitment to your health goals. Please review the following plan we discussed and let me know if I can assist you in the future.   These are the goals we discussed:  Goals      Increase physical activity     Remain active and independent        This is a list of the screening recommended for you and due dates:  Health Maintenance  Topic Date Due   Colon Cancer Screening  Never done   COVID-19 Vaccine (3 - 2023-24 season) 07/31/2022   Zoster (Shingles) Vaccine (1 of 2) Never done   Flu Shot  07/01/2023   Screening for Lung Cancer  02/17/2024   Medicare Annual Wellness Visit  03/03/2024   DTaP/Tdap/Td vaccine (2 - Td or Tdap) 12/28/2032   Hepatitis C Screening: USPSTF Recommendation to screen - Ages 59-79 yo.  Completed   HIV Screening  Completed   HPV Vaccine  Aged Out    Advanced directives: Advance directive discussed with you today. I have provided a copy for you to complete at home and have notarized. Once this is complete please bring a copy in to our office so we can scan it into your chart.   Conditions/risks identified: Aim for 30 minutes of exercise or brisk walking, 6-8 glasses of water, and 5 servings of fruits and vegetables each day.  Next appointment: Follow up in one year for your annual wellness visit   Preventive Care 40-64 Years, Male Preventive care refers to lifestyle choices and visits with your health care provider that can promote health and wellness. What does preventive care include? A yearly physical exam. This is also called an annual well check. Dental exams once or twice a year. Routine eye exams. Ask your health care provider how often you should have your eyes checked. Personal lifestyle choices, including: Daily care of your teeth and gums. Regular physical activity. Eating a healthy diet. Avoiding tobacco and drug use. Limiting  alcohol use. Practicing safe sex. Taking low-dose aspirin every day starting at age 57. What happens during an annual well check? The services and screenings done by your health care provider during your annual well check will depend on your age, overall health, lifestyle risk factors, and family history of disease. Counseling  Your health care provider may ask you questions about your: Alcohol use. Tobacco use. Drug use. Emotional well-being. Home and relationship well-being. Sexual activity. Eating habits. Work and work Statistician. Screening  You may have the following tests or measurements: Height, weight, and BMI. Blood pressure. Lipid and cholesterol levels. These may be checked every 5 years, or more frequently if you are over 68 years old. Skin check. Lung cancer screening. You may have this screening every year starting at age 96 if you have a 30-pack-year history of smoking and currently smoke or have quit within the past 15 years. Fecal occult blood test (FOBT) of the stool. You may have this test every year starting at age 67. Flexible sigmoidoscopy or colonoscopy. You may have a sigmoidoscopy every 5 years or a colonoscopy every 10 years starting at age 86. Prostate cancer screening. Recommendations will vary depending on your family history and other risks. Hepatitis C blood test. Hepatitis B blood test. Sexually transmitted disease (STD) testing. Diabetes screening. This is done by checking your blood sugar (glucose) after you have not eaten for a while (  fasting). You may have this done every 1-3 years. Discuss your test results, treatment options, and if necessary, the need for more tests with your health care provider. Vaccines  Your health care provider may recommend certain vaccines, such as: Influenza vaccine. This is recommended every year. Tetanus, diphtheria, and acellular pertussis (Tdap, Td) vaccine. You may need a Td booster every 10 years. Zoster vaccine.  You may need this after age 60. Pneumococcal 13-valent conjugate (PCV13) vaccine. You may need this if you have certain conditions and have not been vaccinated. Pneumococcal polysaccharide (PPSV23) vaccine. You may need one or two doses if you smoke cigarettes or if you have certain conditions. Talk to your health care provider about which screenings and vaccines you need and how often you need them. This information is not intended to replace advice given to you by your health care provider. Make sure you discuss any questions you have with your health care provider. Document Released: 12/13/2015 Document Revised: 08/05/2016 Document Reviewed: 09/17/2015 Elsevier Interactive Patient Education  2017 [place] Prevention in the Home Falls can cause injuries. They can happen to people of all ages. There are many things you can do to make your home safe and to help prevent falls. What can I do on the outside of my home? Regularly fix the edges of walkways and driveways and fix any cracks. Remove anything that might make you trip as you walk through a door, such as a raised step or threshold. Trim any bushes or trees on the path to your home. Use bright outdoor lighting. Clear any walking paths of anything that might make someone trip, such as rocks or tools. Regularly check to see if handrails are loose or broken. Make sure that both sides of any steps have handrails. Any raised decks and porches should have guardrails on the edges. Have any leaves, snow, or ice cleared regularly. Use sand or salt on walking paths during winter. Clean up any spills in your garage right away. This includes oil or grease spills. What can I do in the bathroom? Use night lights. Install grab bars by the toilet and in the tub and shower. Do not use towel bars as grab bars. Use non-skid mats or decals in the tub or shower. If you need to sit down in the shower, use a plastic, non-slip stool. Keep the  floor dry. Clean up any water that spills on the floor as soon as it happens. Remove soap buildup in the tub or shower regularly. Attach bath mats securely with double-sided non-slip rug tape. Do not have throw rugs and other things on the floor that can make you trip. What can I do in the bedroom? Use night lights. Make sure that you have a light by your bed that is easy to reach. Do not use any sheets or blankets that are too big for your bed. They should not hang down onto the floor. Have a firm chair that has side arms. You can use this for support while you get dressed. Do not have throw rugs and other things on the floor that can make you trip. What can I do in the kitchen? Clean up any spills right away. Avoid walking on wet floors. Keep items that you use a lot in easy-to-reach places. If you need to reach something above you, use a strong step stool that has a grab bar. Keep electrical cords out of the way. Do not use floor polish or wax that makes  floors slippery. If you must use wax, use non-skid floor wax. Do not have throw rugs and other things on the floor that can make you trip. What can I do with my stairs? Do not leave any items on the stairs. Make sure that there are handrails on both sides of the stairs and use them. Fix handrails that are broken or loose. Make sure that handrails are as long as the stairways. Check any carpeting to make sure that it is firmly attached to the stairs. Fix any carpet that is loose or worn. Avoid having throw rugs at the top or bottom of the stairs. If you do have throw rugs, attach them to the floor with carpet tape. Make sure that you have a light switch at the top of the stairs and the bottom of the stairs. If you do not have them, ask someone to add them for you. What else can I do to help prevent falls? Wear shoes that: Do not have high heels. Have rubber bottoms. Are comfortable and fit you well. Are closed at the toe. Do not wear  sandals. If you use a stepladder: Make sure that it is fully opened. Do not climb a closed stepladder. Make sure that both sides of the stepladder are locked into place. Ask someone to hold it for you, if possible. Clearly mark and make sure that you can see: Any grab bars or handrails. First and last steps. Where the edge of each step is. Use tools that help you move around (mobility aids) if they are needed. These include: Canes. Walkers. Scooters. Crutches. Turn on the lights when you go into a dark area. Replace any light bulbs as soon as they burn out. Set up your furniture so you have a clear path. Avoid moving your furniture around. If any of your floors are uneven, fix them. If there are any pets around you, be aware of where they are. Review your medicines with your doctor. Some medicines can make you feel dizzy. This can increase your chance of falling. Ask your doctor what other things that you can do to help prevent falls. This information is not intended to replace advice given to you by your health care provider. Make sure you discuss any questions you have with your health care provider. Document Released: 09/12/2009 Document Revised: 04/23/2016 Document Reviewed: 12/21/2014 Elsevier Interactive Patient Education  2017 [employer].

## 2023-03-04 NOTE — Telephone Encounter (Signed)
Patient seen for AWV is wondering if Cologuard is an option that he can complete for colon screening due to not having someone that can drive him for a colonoscopy.

## 2023-03-04 NOTE — Progress Notes (Signed)
Subjective:   Frederick Roberts is a 52 y.o. male who presents for an Initial Medicare Annual Wellness Visit.  I connected with  Phoebe Perch on 03/04/23 by a audio enabled telemedicine application and verified that I am speaking with the correct person using two identifiers.  Patient Location: Home  Provider Location: Home Office  I discussed the limitations of evaluation and management by telemedicine. The patient expressed understanding and agreed to proceed.  Review of Systems     Cardiac Risk Factors include: male gender;hypertension;sedentary lifestyle;smoking/ tobacco exposure     Objective:    Today's Vitals   03/04/23 1158  Weight: 177 lb (80.3 kg)  Height: 5\' 8"  (1.727 m)   Body mass index is 26.91 kg/m.     03/04/2023   12:07 PM 06/09/2019    1:10 PM 03/15/2014    5:44 PM 03/02/2014   11:35 AM 01/03/2014    2:51 AM  Advanced Directives  Does Patient Have a Medical Advance Directive? No No Patient does not have advance directive;Patient would not like information Patient does not have advance directive;Patient would not like information Patient does not have advance directive;Patient would not like information  Would patient like information on creating a medical advance directive? Yes (MAU/Ambulatory/Procedural Areas - Information given) No - Patient declined     Pre-existing out of facility DNR order (yellow form or pink MOST form)   No  No    Current Medications (verified) Outpatient Encounter Medications as of 03/04/2023  Medication Sig   albuterol (VENTOLIN HFA) 108 (90 Base) MCG/ACT inhaler Inhale 2 puffs into the lungs every 4 (four) hours as needed for wheezing or shortness of breath.   amLODipine (NORVASC) 10 MG tablet Take 1 tablet (10 mg total) by mouth daily.   atenolol (TENORMIN) 25 MG tablet Take 1 tablet (25 mg total) by mouth daily.   atorvastatin (LIPITOR) 10 MG tablet Take 1 tablet (10 mg total) by mouth daily.   famotidine (PEPCID) 20 MG tablet One  after supper   indomethacin (INDOCIN) 50 MG capsule Take 1 capsule (50 mg total) by mouth 2 (two) times daily with a meal.   omeprazole (PRILOSEC) 40 MG capsule Take 1 capsule (40 mg total) by mouth daily.   QUEtiapine (SEROQUEL) 100 MG tablet Take 100 mg by mouth at bedtime.   SUMAtriptan (IMITREX) 50 MG tablet Take 50 mg by mouth every 2 (two) hours as needed for migraine or headache. May repeat in 2 hours if headache persists or recurs.   Tiotropium Bromide Monohydrate (SPIRIVA RESPIMAT) 2.5 MCG/ACT AERS Inhale 2 puffs into the lungs daily.   Tiotropium Bromide-Olodaterol (STIOLTO RESPIMAT) 2.5-2.5 MCG/ACT AERS Inhale 2 puffs into the lungs daily.   traMADol (ULTRAM) 50 MG tablet Take 50 mg by mouth 3 (three) times daily as needed (for pain).   No facility-administered encounter medications on file as of 03/04/2023.    Allergies (verified) Doxycycline monohydrate   History: Past Medical History:  Diagnosis Date   Anxiety    Arthritis    "joints" (03/15/2014)   Asthma    as a child   Depression    hx of suicidal thoughts and one time attempt   GERD (gastroesophageal reflux disease)    takes Prilosec   Headache(784.0)    "2-3 times/wk" (03/15/2014)   Hypertension    Migraines    "@ least once/wk" (03/15/2014)   OSA on CPAP    "sometimes" uses CPAP   PTSD (post-traumatic stress disorder)    Sickle  cell trait    Sleep apnea    Past Surgical History:  Procedure Laterality Date   APPENDECTOMY  1993   BUNIONECTOMY Right 2003?   COLONOSCOPY     ESOPHAGOGASTRODUODENOSCOPY ENDOSCOPY     NASAL SEPTUM SURGERY  ~ 1992   SHOULDER ARTHROSCOPY Right 2013   "got tight after last OR; had to loosen it up & debride it"   SHOULDER HEMI-ARTHROPLASTY Right 2010   "put pins in it to stop it from dislocating"   SHOULDER HEMI-ARTHROPLASTY Right 03/15/2014   SHOULDER HEMI-ARTHROPLASTY Right 03/15/2014   Procedure: SHOULDER HEMI-ARTHROPLASTY;  Surgeon: Nita Sells, MD;  Location: [facility];  Service: Orthopedics;  Laterality: Right;  Rigth shoulder hemiarthroplasty   VASECTOMY     WISDOM TOOTH EXTRACTION  1991   WRIST FRACTURE SURGERY Right 1993   Family History  Problem Relation Age of Onset   Colon cancer Neg Hx    Colon polyps Neg Hx    Esophageal cancer Neg Hx    Rectal cancer Neg Hx    Stomach cancer Neg Hx    Social History   Socioeconomic History   Marital status: Legally Separated    Spouse name: Not on file   Number of children: Not on file   Years of education: Not on file   Highest education level: Not on file  Occupational History   Not on file  Tobacco Use   Smoking status: Every Day    Packs/day: 1.00    Years: 24.00    Additional pack years: 0.00    Total pack years: 24.00    Types: Cigarettes   Smokeless tobacco: Never   Tobacco comments:    Pt states he smokes 1 pack a day as of 11/10/2022. LW  Substance and Sexual Activity   Alcohol use: Yes    Alcohol/week: 12.0 standard drinks of alcohol    Types: 12 Cans of beer per week   Drug use: No   Sexual activity: Not Currently  Other Topics Concern   Not on file  Social History Narrative   Not on file   Social Determinants of Health   Financial Resource Strain: Low Risk  (03/04/2023)   Overall Financial Resource Strain (CARDIA)    Difficulty of Paying Living Expenses: Not hard at all  Food Insecurity: No Food Insecurity (03/04/2023)   Hunger Vital Sign    Worried About Running Out of Food in the Last Year: Never true    Ran Out of Food in the Last Year: Never true  Transportation Needs: No Transportation Needs (03/04/2023)   PRAPARE - Hydrologist (Medical): No    Lack of Transportation (Non-Medical): No  Physical Activity: Inactive (03/04/2023)   Exercise Vital Sign    Days of Exercise per Week: 0 days    Minutes of Exercise per Session: 0 min  Stress: No Stress Concern Present (03/04/2023)   [place]    Feeling of Stress : Not at all  Social Connections: Unknown (03/04/2023)   Social Connection and Isolation Panel [NHANES]    Frequency of Communication with Friends and Family: More than three times a week    Frequency of Social Gatherings with Friends and Family: Three times a week    Attends Religious Services: 1 to 4 times per year    Active Member of Clubs or Organizations: No    Attends Archivist Meetings: Never    Marital  Status: Patient declined    Tobacco Counseling Ready to quit: Not Answered Counseling given: Not Answered Tobacco comments: Pt states he smokes 1 pack a day as of 11/10/2022. LW   Clinical Intake:  Pre-visit preparation completed: Yes  Pain : No/denies pain  Diabetes: No  How often do you need to have someone help you when you read instructions, pamphlets, or other written materials from your doctor or pharmacy?: 1 - Never  Diabetic?No   Interpreter Needed?: No  Information entered by :: Denman George LPN   Activities of Daily Living    03/04/2023   12:07 PM  In your present state of health, do you have any difficulty performing the following activities:  Hearing? 0  Vision? 0  Difficulty concentrating or making decisions? 0  Walking or climbing stairs? 0  Dressing or bathing? 0  Doing errands, shopping? 0  Preparing Food and eating ? N  Using the Toilet? N  In the past six months, have you accidently leaked urine? N  Do you have problems with loss of bowel control? N  Managing your Medications? N  Managing your Finances? N  Housekeeping or managing your Housekeeping? N    Patient Care Team: Rubie Maid, FNP as PCP - General (Family Medicine)  Indicate any recent Medical Services you may have received from other than [facility] providers in the past year (date may be approximate).     Assessment:   This is a routine wellness examination for Reeves.  Hearing/Vision screen Hearing Screening - Comments:: Denies  hearing difficulties  Vision Screening - Comments:: Wears rx glasses - up to date with routine eye exams   Dietary issues and exercise activities discussed: Current Exercise Habits: The patient does not participate in regular exercise at present   Goals Addressed             This Visit's Progress    Increase physical activity       Remain active and independent        Depression Screen    03/04/2023   12:00 PM 12/28/2022   10:57 AM  PHQ 2/9 Scores  PHQ - 2 Score 0 5  PHQ- 9 Score  15    Fall Risk    03/04/2023   11:59 AM 12/28/2022   10:56 AM  Fall Risk   Falls in the past year? 0 0  Number falls in past yr: 0 0  Injury with Fall? 0 0  Risk for fall due to : No Fall Risks   Follow up Falls prevention discussed;Education provided;Falls evaluation completed     FALL RISK PREVENTION PERTAINING TO THE HOME:  Any stairs in or around the home? No  If so, are there any without handrails? No  Home free of loose throw rugs in walkways, pet beds, electrical cords, etc? Yes  Adequate lighting in your home to reduce risk of falls? Yes   ASSISTIVE DEVICES UTILIZED TO PREVENT FALLS:  Life alert? No  Use of a cane, walker or w/c? No  Grab bars in the bathroom? Yes  Shower chair or bench in shower? No  Elevated toilet seat or a handicapped toilet? No   TIMED UP AND GO:  Was the test performed? No . Telephonic visit   Cognitive Function:        03/04/2023   12:08 PM  6CIT Screen  What Year? 0 points  What month? 0 points  What time? 0 points  Count back from 20 0 points  Months in reverse 0 points  Repeat phrase 0 points  Total Score 0 points    Immunizations Immunization History  Administered Date(s) Administered   Hepatitis A, Ped/Adol-2 Dose 07/17/2005   Hepatitis B, PED/ADOLESCENT 07/17/2005   Influenza,inj,Quad PF,6+ Mos 09/21/2022   PFIZER(Purple Top)SARS-COV-2 Vaccination 02/22/2020, 03/18/2020   Pneumococcal-Unspecified 09/15/2016   Tdap 12/28/2022     TDAP status: Up to date  Flu Vaccine status: Up to date  Pneumococcal vaccine status: Up to date  Covid-19 vaccine status: Information provided on how to obtain vaccines.   Qualifies for Shingles Vaccine? Yes   Zostavax completed No   Shingrix Completed?: No.    Education has been provided regarding the importance of this vaccine. Patient has been advised to call insurance company to determine out of pocket expense if they have not yet received this vaccine. Advised may also receive vaccine at local pharmacy or Health Dept. Verbalized acceptance and understanding.  Screening Tests Health Maintenance  Topic Date Due   COLONOSCOPY (Pts 45-73yrs Insurance coverage will need to be confirmed)  Never done   COVID-19 Vaccine (3 - 2023-24 season) 07/31/2022   Zoster Vaccines- Shingrix (1 of 2) Never done   INFLUENZA VACCINE  07/01/2023   Lung Cancer Screening  02/17/2024   Medicare Annual Wellness (AWV)  03/03/2024   DTaP/Tdap/Td (2 - Td or Tdap) 12/28/2032   Hepatitis C Screening  Completed   HIV Screening  Completed   HPV VACCINES  Aged Out    Health Maintenance  Health Maintenance Due  Topic Date Due   COLONOSCOPY (Pts 45-81yrs Insurance coverage will need to be confirmed)  Never done   COVID-19 Vaccine (3 - 2023-24 season) 07/31/2022   Zoster Vaccines- Shingrix (1 of 2) Never done    Colorectal cancer screening:  Patient had to cancel colonoscopy with Dr. Havery Moros that was scheduled for 02/11/23; is wondering if Cologuard is an option instead   Lung Cancer Screening: (Low Dose CT Chest recommended if Age 77-80 years, 30 pack-year currently smoking OR have quit w/in 15years.) does qualify.   Lung Cancer Screening Referral: last 02/17/23  Additional Screening:  Hepatitis C Screening: does qualify; Completed 12/29/22  Vision Screening: Recommended annual ophthalmology exams for early detection of glaucoma and other disorders of the eye. Is the patient up to date with  their annual eye exam?  No  Who is the provider or what is the name of the office in which the patient attends annual eye exams? none If pt is not established with a provider, would they like to be referred to a provider to establish care? No .   Dental Screening: Recommended annual dental exams for proper oral hygiene  Community Resource Referral / Chronic Care Management: CRR required this visit?  No   CCM required this visit?  No      Plan:     I have personally reviewed and noted the following in the patient's chart:   Medical and social history Use of alcohol, tobacco or illicit drugs  Current medications and supplements including opioid prescriptions. Patient is not currently taking opioid prescriptions. Functional ability and status Nutritional status Physical activity Advanced directives List of other physicians Hospitalizations, surgeries, and ER visits in previous 12 months Vitals Screenings to include cognitive, depression, and falls Referrals and appointments  In addition, I have reviewed and discussed with patient certain preventive protocols, quality metrics, and best practice recommendations. A written personalized care plan for preventive services as well as general preventive health recommendations  were provided to patient.     Denman George [place], [place]   [id number]   Nurse Notes: Patient is wondering if Cologuard is an option that he can complete for colon screening due to not having someone that can drive him for a colonoscopy.

## 2023-03-08 ENCOUNTER — Other Ambulatory Visit: Payer: Self-pay | Admitting: Family Medicine

## 2023-03-08 DIAGNOSIS — Z1211 Encounter for screening for malignant neoplasm of colon: Secondary | ICD-10-CM

## 2023-03-08 NOTE — Telephone Encounter (Signed)
Noted  

## 2023-03-19 DIAGNOSIS — Z1211 Encounter for screening for malignant neoplasm of colon: Secondary | ICD-10-CM | POA: Diagnosis not present

## 2023-03-28 LAB — COLOGUARD: COLOGUARD: NEGATIVE

## 2023-05-08 ENCOUNTER — Other Ambulatory Visit (HOSPITAL_COMMUNITY): Payer: Self-pay
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
## 2023-05-20 ENCOUNTER — Ambulatory Visit (INDEPENDENT_AMBULATORY_CARE_PROVIDER_SITE_OTHER): Payer: No Typology Code available for payment source | Admitting: Internal Medicine

## 2023-05-20 ENCOUNTER — Encounter: Payer: Self-pay | Admitting: Internal Medicine

## 2023-05-20 VITALS — BP 124/81 | HR 60 | Ht 68.0 in | Wt 172.8 lb

## 2023-05-20 DIAGNOSIS — J449 Chronic obstructive pulmonary disease, unspecified: Secondary | ICD-10-CM

## 2023-05-20 DIAGNOSIS — R058 Other specified cough: Secondary | ICD-10-CM

## 2023-05-20 DIAGNOSIS — F17210 Nicotine dependence, cigarettes, uncomplicated: Secondary | ICD-10-CM

## 2023-05-20 MED ORDER — STIOLTO RESPIMAT 2.5-2.5 MCG/ACT IN AERS: INHALATION_SPRAY | RESPIRATORY_TRACT | Status: DC

## 2023-05-20 NOTE — Assessment & Plan Note (Addendum)
Counseled re importance of smoking cessation but did not meet time criteria for separate billing     F/u  q 6 m, sooner if needed, with all meds in hand using a trust but verify approach to confirm accurate Medication  Reconciliation The principal here is that until we are certain that the  patients are doing what we've asked, it makes no sense to ask them to do more.       Each maintenance medication was reviewed in detail including emphasizing most importantly the difference between maintenance and prns and under what circumstances the prns are to be triggered using an action plan format where appropriate.  Total time for H and P, chart review, counseling, reviewing smi device(s) and generating customized AVS unique to this office visit / same day charting = 30 min

## 2023-05-20 NOTE — Patient Instructions (Addendum)
No change in medications - call if not on Stiolto so we can correct the record   Please schedule a follow up visit in 6 months but call sooner if needed with inhalers in hand

## 2023-05-20 NOTE — Assessment & Plan Note (Addendum)
Active smoker - [facility] suggest [facility] Sept 7 2023  - 09/21/2022   Walked on RA  x  3  lap(s) =  approx 450  ft  @ mod to fast  pace, stopped due to end of study  with lowest 02 sats 95% and no sob  - 09/21/2022    try stiolto 2 each am and Airsupra sample to replace his proair for prn use  > much better - 10/09/2022  After extensive coaching inhaler device,  effectiveness =    90% > continue stiolto 2 each am x 4 weeks  > improved but stiolto not on formulary  - 11/10/2022  After extensive coaching inhaler device,  effectiveness =    95% on smi > changed to spriiva 2.5  LDSCT :  02/17/23 per PCP  RADS 2 /Mild paraseptal emphysema    Pt is Group B in terms of symptom/risk and laba/lama therefore appropriate rx at this point >>>  stiolto best choice if [facility] will suppply, otherwise fine to continue spiriva for now.

## 2023-05-20 NOTE — Assessment & Plan Note (Signed)
Onset around 2013 - 09/21/2022  max rx for gerd / d/c all dpi's > much improved at ov 10/09/2022 > resolved as of  05/20/2023 ov   >>> no change needed
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
## 2023-11-16 ENCOUNTER — Ambulatory Visit: Payer: No Typology Code available for payment source | Admitting: Internal Medicine

## 2023-11-16 ENCOUNTER — Encounter: Payer: Self-pay | Admitting: Internal Medicine

## 2024-01-14 ENCOUNTER — Encounter: Payer: Self-pay | Admitting: Internal Medicine

## 2024-01-14 ENCOUNTER — Ambulatory Visit (INDEPENDENT_AMBULATORY_CARE_PROVIDER_SITE_OTHER): Payer: No Typology Code available for payment source | Admitting: Internal Medicine

## 2024-01-14 VITALS — BP 110/71 | HR 73 | Ht 68.0 in | Wt 169.0 lb

## 2024-01-14 DIAGNOSIS — J449 Chronic obstructive pulmonary disease, unspecified: Secondary | ICD-10-CM
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
## 2024-01-14 NOTE — Patient Instructions (Addendum)
The key is to stop smoking completely before smoking completely stops you!  Work on inhaler technique:  relax and gently blow all the way out then take a nice smooth full deep breath back in, triggering the inhaler at same time you start breathing in.  Hold breath in for at least  5 seconds if you can.    Also  Ok to try albuterol 15 min before an activity (on alternating days between the nebulizer, the inhaler and nothing)  that you know would usually make you short of breath and see if it makes any difference and if makes none then don't take albuterol after activity unless you can't catch your breath as this means it's the resting that helps, not the albuterol.  If albuterol makes a lot a difference I can call in a long acting form of albuterol not powder (symbiocrt and Breyna)  for you but you must bring it back so I can help you learn it   Please schedule a follow up visit in 3 months but call sooner if needed

## 2024-01-15 NOTE — Assessment & Plan Note (Signed)
[facility] 7 2023  - 09/21/2022   Walked on RA  x  3  lap(s) =  approx 450  ft  @ mod to fast  pace, stopped due to end of study  with lowest 02 sats 95% and no sob  - 09/21/2022    try stiolto 2 each am and Airsupra sample to replace his proair for prn use  > much better - 10/09/2022  After extensive coaching inhaler device,  effectiveness =    90% > continue stiolto 2 each am x 4 weeks  > improved but stiolto not on formulary  - 11/10/2022  After extensive coaching inhaler device,  effectiveness =    95% on smi > changed to spriiva 2.5  LDSCT :  02/17/23 per PCP  RADS 2 /Mild paraseptal emphysema -  01/14/2024  After extensive coaching inhaler device,  effectiveness =    50% (early trigger, late iins  Not really clear he's doing well on spiriva but not using saba as rec and can't get stiolto from [place] so rec  Re SABA :  I spent extra time with pt today reviewing appropriate use of albuterol for prn use on exertion with the following points: 1) saba is for relief of sob that does not improve by walking a slower pace or resting but rather if the pt does not improve after trying this first. 2) If the pt is convinced, as many are, that saba helps recover from activity faster then it's easy to tell if this is the case by re-challenging : ie stop, take the inhaler, then p 5 minutes try the exact same activity (intensity of workload) that just caused the symptoms and see if they are substantially diminished or not after saba 3) if there is an activity that reproducibly causes the symptoms, try the saba 15 min before the activity on alternate days   If in fact the saba really does help, then fine to continue to use it prn but advised may need to look closer at the maintenance regimen (now spiriva but could try to get breyna from [place] for the laba component and avoid dpi's which caused choking sensations in past)  being used to achieve better control of airways disease with  exertion.  Discussed in detail all the  indications, usual  risks and alternatives  relative to the benefits with patient who agrees to proceed with Rx as outlined.      F/u in 3 m with all meds in hand using a trust but verify approach to confirm accurate Medication  Reconciliation The principal here is that until we are certain that the  patients are doing what we've asked, it makes no sense to ask them to do more.          Each maintenance medication was reviewed in detail including emphasizing most importantly the difference between maintenance and prns and under what circumstances the prns are to be triggered using an action plan format where appropriate.  Total time for H and P, chart review, counseling, reviewing hfa/smi  device(s) and generating customized AVS unique to this office visit / same day charting = 37 min

## 2024-03-21 ENCOUNTER — Other Ambulatory Visit: Payer: Self-pay

## 2024-03-21 DIAGNOSIS — J449 Chronic obstructive pulmonary disease, unspecified: Secondary | ICD-10-CM

## 2024-03-21 NOTE — Telephone Encounter (Signed)
 Pt had called and lm stated that he had no receive his inhaler. Per you last office note patient was to call us  back to let us  know if the albuterol  inhaler was helping , pt didn't follow up back up or make an follow up. Called and spoke with patient he stated that the albuterol  and spiriva  helped. Pt would like this sent to the [facility] and make an appt for patient to follow up in May that would be his 3 month from his last appt.

## 2024-03-22 ENCOUNTER — Telehealth: Payer: Self-pay

## 2024-03-22 ENCOUNTER — Other Ambulatory Visit (HOSPITAL_COMMUNITY): Payer: Self-pay

## 2024-03-22 ENCOUNTER — Other Ambulatory Visit: Payer: Self-pay | Admitting: Internal Medicine

## 2024-03-22 DIAGNOSIS — J449 Chronic obstructive pulmonary disease, unspecified: Secondary | ICD-10-CM

## 2024-03-22 MED ORDER — ALBUTEROL SULFATE HFA 108 (90 BASE) MCG/ACT IN AERS: 2.0000 | INHALATION_SPRAY | RESPIRATORY_TRACT | 2 refills | Status: DC | PRN

## 2024-03-22 MED ORDER — SPIRIVA RESPIMAT 2.5 MCG/ACT IN AERS: 2.0000 | INHALATION_SPRAY | Freq: Once | RESPIRATORY_TRACT | 2 refills | Status: AC

## 2024-03-22 NOTE — Telephone Encounter (Signed)
*  Pulm  Pharmacy Patient Advocate Encounter   Received notification from Pt Calls Messages that prior authorization for spiriva  respimat is required/requested.   Insurance verification completed.   The patient is insured through [employer] .   Per test claim:  Incruse Ellipta  is preferred by the insurance.  If suggested medication is appropriate, Please send in a new RX and discontinue this one. If not, please advise as to why it's not appropriate so that we may request a Prior Authorization. Please note, some preferred medications may still require a PA.  If the suggested medications have not been trialed and there are no contraindications to their use, the PA will not be submitted, as it will not be approved.

## 2024-03-22 NOTE — Telephone Encounter (Signed)
 Is this an appropriate change?

## 2024-03-22 NOTE — Telephone Encounter (Signed)
 Preferred medication at this time is Incruse Ellipta 

## 2024-03-22 NOTE — Telephone Encounter (Signed)
 Have we received a [facility] from [facility]?

## 2024-03-22 NOTE — Telephone Encounter (Signed)
 Copied from CRM [id number]. Topic: Clinical - Prescription Issue >> Mar 22, 2024 12:00 PM Frederick Roberts wrote: Reason for CRM: Pt stated he visited his preferred pharmacy [facility] and they stated that Dr. Waymond Hailey did not fill out the proper paperwork for Tiotropium Bromide Monohydrate  (SPIRIVA  RESPIMAT) 2.5 MCG/ACT AERS for his insurance provider. Pt stated once he completes that and sends it back his insurance provider he would then be able to get his medication. Please follow up with the pt at [phone].

## 2024-03-22 NOTE — Telephone Encounter (Signed)
 Please advise for best alternative

## 2024-03-22 NOTE — Telephone Encounter (Signed)
 Spoke with patient as I did not know this was already handled. He wishes to have the scripts [facility] to [facility]- Will send per Dr. Waymond Hailey

## 2024-03-22 NOTE — Telephone Encounter (Signed)
Please advise changes? 

## 2024-03-23 ENCOUNTER — Other Ambulatory Visit (HOSPITAL_COMMUNITY): Payer: Self-pay

## 2024-03-23 MED ORDER — UMECLIDINIUM BROMIDE 62.5 MCG/ACT IN AEPB: 1.0000 | INHALATION_SPRAY | Freq: Every day | RESPIRATORY_TRACT | 2 refills | Status: AC

## 2024-03-23 NOTE — Telephone Encounter (Signed)
 Sent to [facility] on [address]

## 2024-03-23 NOTE — Telephone Encounter (Signed)
 Kickback from pharmacy that Incruse Ellipta  is not covered. I sent the generic. Please advise

## 2024-03-23 NOTE — Telephone Encounter (Signed)
 It is currently being filled.

## 2024-03-23 NOTE — Telephone Encounter (Signed)
 Incruse Ellipta  is preferred
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
## 2024-04-27 ENCOUNTER — Encounter: Payer: Self-pay | Admitting: Internal Medicine

## 2024-04-27 ENCOUNTER — Ambulatory Visit: Admitting: Internal Medicine

## 2024-07-08 ENCOUNTER — Other Ambulatory Visit: Payer: Self-pay | Admitting: Internal Medicine

## 2024-07-08 DIAGNOSIS — J449 Chronic obstructive pulmonary disease, unspecified: Secondary | ICD-10-CM
